# Patient Record
Sex: Male | Born: 1940
Health system: Southern US, Community
[De-identification: ages and names within clinical notes are randomized; demographics above are authoritative.]

## PROBLEM LIST (undated history)

## (undated) DIAGNOSIS — I10 Essential (primary) hypertension: Secondary | ICD-10-CM

## (undated) DIAGNOSIS — Z952 Presence of prosthetic heart valve: Secondary | ICD-10-CM

## (undated) DIAGNOSIS — Z9889 Other specified postprocedural states: Secondary | ICD-10-CM

## (undated) DIAGNOSIS — I2510 Atherosclerotic heart disease of native coronary artery without angina pectoris: Secondary | ICD-10-CM

## (undated) DIAGNOSIS — Z7901 Long term (current) use of anticoagulants: Secondary | ICD-10-CM

## (undated) DIAGNOSIS — Z951 Presence of aortocoronary bypass graft: Secondary | ICD-10-CM

## (undated) DIAGNOSIS — F39 Unspecified mood [affective] disorder: Secondary | ICD-10-CM

## (undated) DIAGNOSIS — E782 Mixed hyperlipidemia: Secondary | ICD-10-CM

## (undated) DIAGNOSIS — G8929 Other chronic pain: Secondary | ICD-10-CM

## (undated) DIAGNOSIS — M25511 Pain in right shoulder: Secondary | ICD-10-CM

## (undated) HISTORY — DX: Presence of aortocoronary bypass graft: Z95.1

## (undated) HISTORY — DX: Long term (current) use of anticoagulants: Z79.01

## (undated) HISTORY — DX: Pain in right shoulder: M25.511

## (undated) HISTORY — DX: Mixed hyperlipidemia: E78.2

## (undated) HISTORY — DX: Atherosclerotic heart disease of native coronary artery without angina pectoris: I25.10

## (undated) HISTORY — DX: Unspecified mood (affective) disorder: F39

## (undated) HISTORY — DX: Presence of prosthetic heart valve: Z95.2

## (undated) HISTORY — PX: CATARACT EXTRACTION, BILATERAL: SHX1313

## (undated) HISTORY — DX: Other specified postprocedural states: Z98.890

## (undated) HISTORY — DX: Essential (primary) hypertension: I10

## (undated) HISTORY — DX: Other chronic pain: G89.29

## (undated) HISTORY — PX: CARDIAC VALVE REPLACEMENT: SHX585

---

## 2003-04-17 ENCOUNTER — Ambulatory Visit (HOSPITAL_COMMUNITY): Admission: RE | Admit: 2003-04-17 | Discharge: 2003-04-18 | Payer: Self-pay | Admitting: Internal Medicine

## 2004-05-28 ENCOUNTER — Ambulatory Visit (HOSPITAL_COMMUNITY): Admission: RE | Admit: 2004-05-28 | Discharge: 2004-05-29 | Payer: Self-pay | Admitting: Cardiology

## 2004-05-28 ENCOUNTER — Ambulatory Visit: Payer: Self-pay | Admitting: Cardiology

## 2004-06-11 ENCOUNTER — Inpatient Hospital Stay (HOSPITAL_COMMUNITY): Admission: RE | Admit: 2004-06-11 | Discharge: 2004-06-17 | Payer: Self-pay | Admitting: Surgery

## 2004-07-08 ENCOUNTER — Encounter: Admission: RE | Admit: 2004-07-08 | Discharge: 2004-07-08 | Payer: Self-pay | Admitting: Surgery

## 2006-06-21 IMAGING — CR DG CHEST 2V
2 series · 2 of 2 positions shown · non-contrast
Comparison: 04/17/03.

CLINICAL DATA: Precardiac cath respiratory film. 
 PA AND LATERAL CHEST:

[view not recorded (1 of 2)]
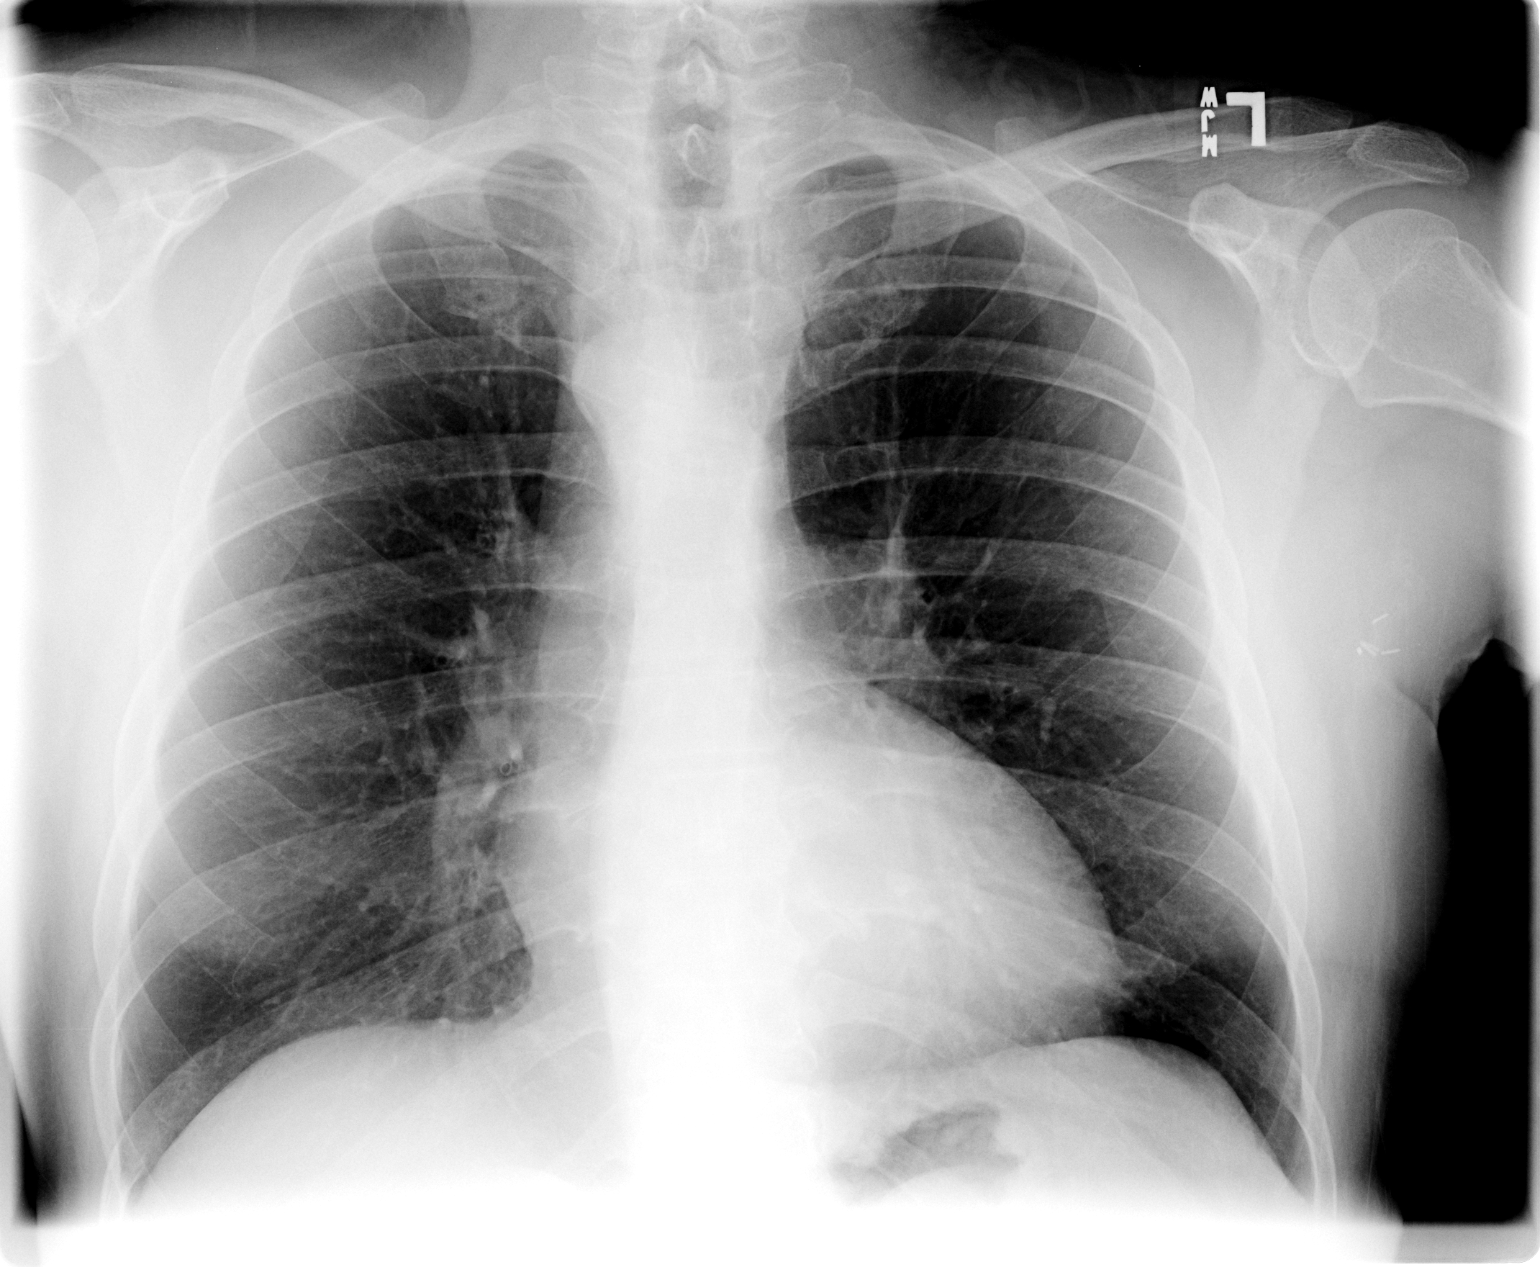

[view not recorded (2 of 2)]
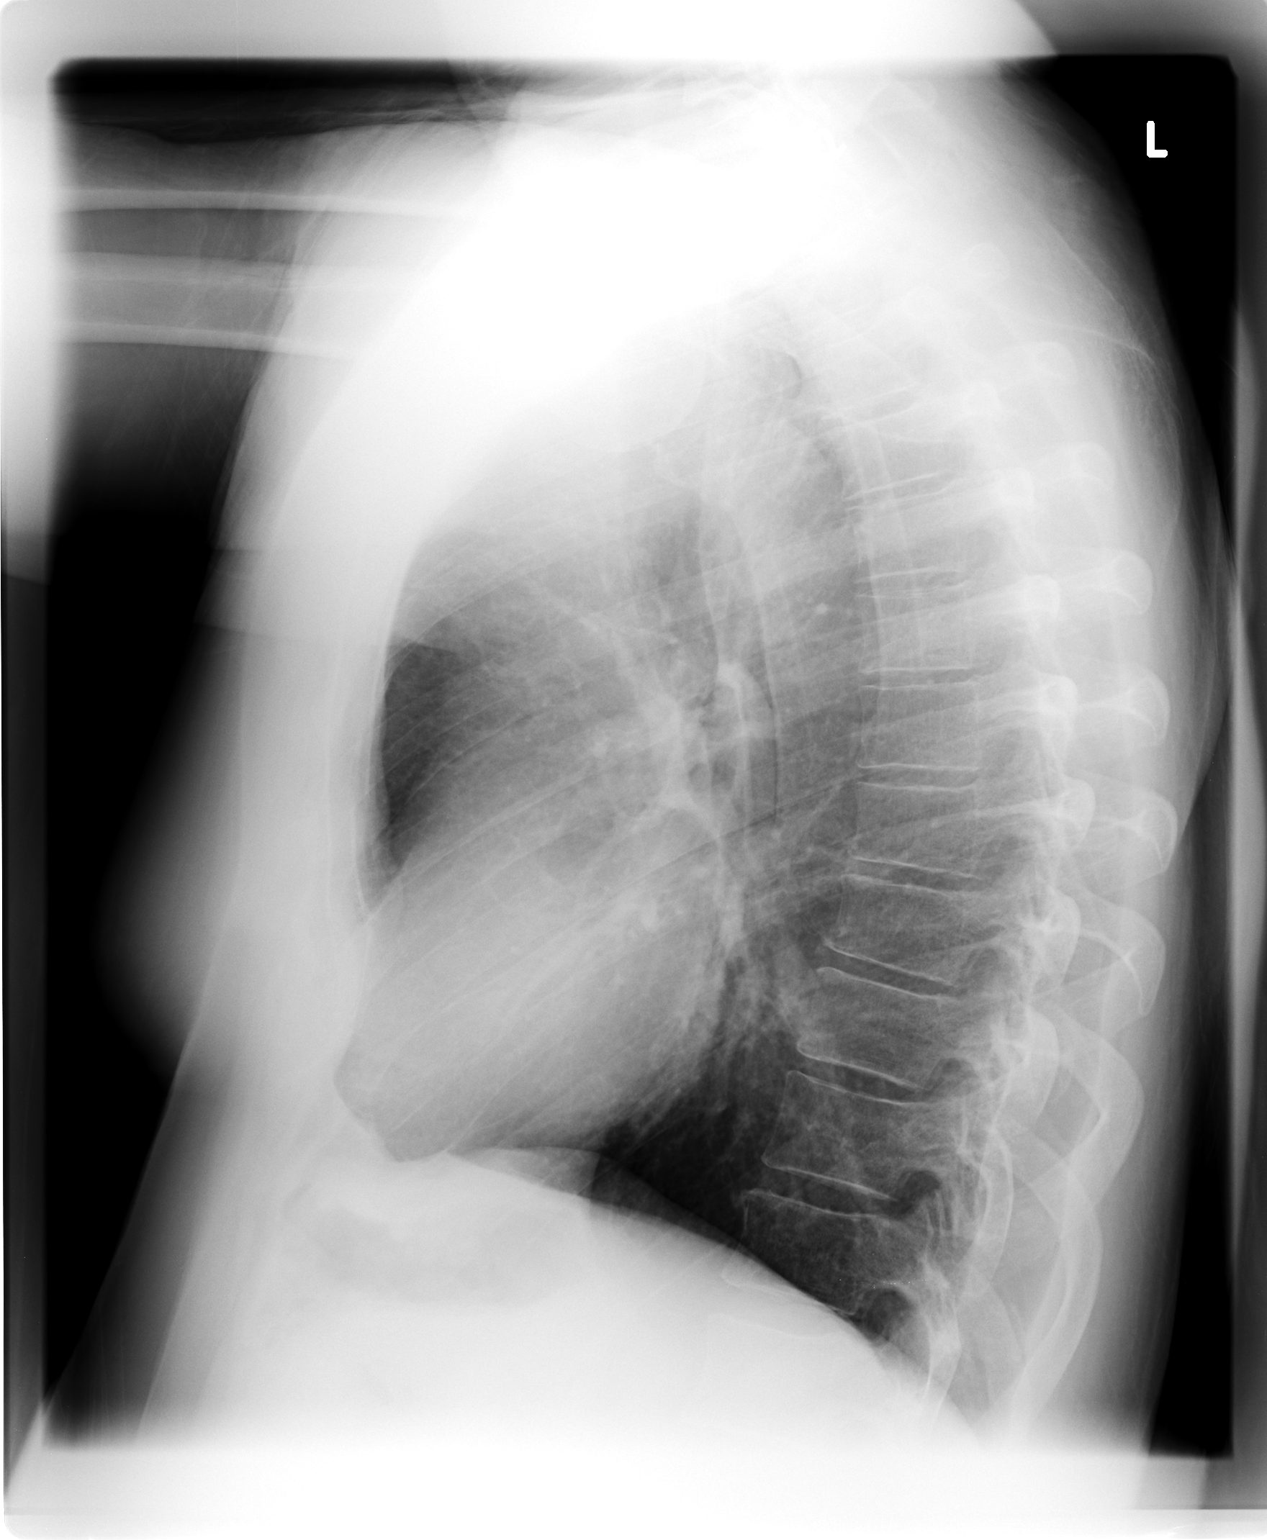

[2 of 2 positions shown; findings below may reference images not displayed]

FINDINGS: The lungs are clear.  The heart size is normal.  No pleural effusion.  No focal bony abnormality.  Clips are noted projecting over the left axilla.
IMPRESSION: No acute disease.

## 2010-02-23 ENCOUNTER — Encounter: Payer: Self-pay | Admitting: Surgery

## 2015-02-13 DIAGNOSIS — I4891 Unspecified atrial fibrillation: Secondary | ICD-10-CM | POA: Diagnosis not present

## 2015-02-13 DIAGNOSIS — Z7901 Long term (current) use of anticoagulants: Secondary | ICD-10-CM | POA: Diagnosis not present

## 2015-02-28 DIAGNOSIS — M19011 Primary osteoarthritis, right shoulder: Secondary | ICD-10-CM | POA: Diagnosis not present

## 2015-02-28 DIAGNOSIS — M25511 Pain in right shoulder: Secondary | ICD-10-CM

## 2015-02-28 DIAGNOSIS — M7541 Impingement syndrome of right shoulder: Secondary | ICD-10-CM | POA: Diagnosis not present

## 2015-02-28 DIAGNOSIS — G8929 Other chronic pain: Secondary | ICD-10-CM | POA: Insufficient documentation

## 2015-02-28 HISTORY — DX: Other chronic pain: G89.29

## 2015-03-04 DIAGNOSIS — M25511 Pain in right shoulder: Secondary | ICD-10-CM | POA: Diagnosis not present

## 2015-03-04 DIAGNOSIS — M7541 Impingement syndrome of right shoulder: Secondary | ICD-10-CM | POA: Diagnosis not present

## 2015-03-06 DIAGNOSIS — M7541 Impingement syndrome of right shoulder: Secondary | ICD-10-CM | POA: Diagnosis not present

## 2015-03-06 DIAGNOSIS — M25511 Pain in right shoulder: Secondary | ICD-10-CM | POA: Diagnosis not present

## 2015-03-12 DIAGNOSIS — M7541 Impingement syndrome of right shoulder: Secondary | ICD-10-CM | POA: Diagnosis not present

## 2015-03-12 DIAGNOSIS — M25511 Pain in right shoulder: Secondary | ICD-10-CM | POA: Diagnosis not present

## 2015-03-12 DIAGNOSIS — I482 Chronic atrial fibrillation: Secondary | ICD-10-CM | POA: Diagnosis not present

## 2015-03-14 DIAGNOSIS — M7541 Impingement syndrome of right shoulder: Secondary | ICD-10-CM | POA: Diagnosis not present

## 2015-03-14 DIAGNOSIS — M25511 Pain in right shoulder: Secondary | ICD-10-CM | POA: Diagnosis not present

## 2015-03-18 DIAGNOSIS — M25511 Pain in right shoulder: Secondary | ICD-10-CM | POA: Diagnosis not present

## 2015-03-18 DIAGNOSIS — M7541 Impingement syndrome of right shoulder: Secondary | ICD-10-CM | POA: Diagnosis not present

## 2015-03-21 DIAGNOSIS — M25511 Pain in right shoulder: Secondary | ICD-10-CM | POA: Diagnosis not present

## 2015-03-21 DIAGNOSIS — M7541 Impingement syndrome of right shoulder: Secondary | ICD-10-CM | POA: Diagnosis not present

## 2015-03-25 DIAGNOSIS — M7541 Impingement syndrome of right shoulder: Secondary | ICD-10-CM | POA: Diagnosis not present

## 2015-03-25 DIAGNOSIS — M25511 Pain in right shoulder: Secondary | ICD-10-CM | POA: Diagnosis not present

## 2015-03-27 DIAGNOSIS — M25511 Pain in right shoulder: Secondary | ICD-10-CM | POA: Diagnosis not present

## 2015-03-27 DIAGNOSIS — M7541 Impingement syndrome of right shoulder: Secondary | ICD-10-CM | POA: Diagnosis not present

## 2015-04-01 DIAGNOSIS — I4891 Unspecified atrial fibrillation: Secondary | ICD-10-CM | POA: Diagnosis not present

## 2015-04-01 DIAGNOSIS — M7541 Impingement syndrome of right shoulder: Secondary | ICD-10-CM | POA: Diagnosis not present

## 2015-04-01 DIAGNOSIS — M25511 Pain in right shoulder: Secondary | ICD-10-CM | POA: Diagnosis not present

## 2015-04-04 DIAGNOSIS — M25511 Pain in right shoulder: Secondary | ICD-10-CM | POA: Diagnosis not present

## 2015-04-10 DIAGNOSIS — M7551 Bursitis of right shoulder: Secondary | ICD-10-CM | POA: Diagnosis not present

## 2015-04-10 DIAGNOSIS — M25542 Pain in joints of left hand: Secondary | ICD-10-CM | POA: Diagnosis not present

## 2015-04-10 DIAGNOSIS — M25541 Pain in joints of right hand: Secondary | ICD-10-CM | POA: Diagnosis not present

## 2015-04-10 DIAGNOSIS — S46811A Strain of other muscles, fascia and tendons at shoulder and upper arm level, right arm, initial encounter: Secondary | ICD-10-CM | POA: Diagnosis not present

## 2015-04-10 DIAGNOSIS — M25511 Pain in right shoulder: Secondary | ICD-10-CM | POA: Diagnosis not present

## 2015-04-10 DIAGNOSIS — X58XXXA Exposure to other specified factors, initial encounter: Secondary | ICD-10-CM | POA: Diagnosis not present

## 2015-04-10 DIAGNOSIS — R609 Edema, unspecified: Secondary | ICD-10-CM | POA: Diagnosis not present

## 2015-04-10 DIAGNOSIS — E568 Deficiency of other vitamins: Secondary | ICD-10-CM | POA: Diagnosis not present

## 2015-04-10 DIAGNOSIS — M7541 Impingement syndrome of right shoulder: Secondary | ICD-10-CM | POA: Diagnosis not present

## 2015-04-15 DIAGNOSIS — M7541 Impingement syndrome of right shoulder: Secondary | ICD-10-CM | POA: Diagnosis not present

## 2015-04-16 DIAGNOSIS — Z7901 Long term (current) use of anticoagulants: Secondary | ICD-10-CM | POA: Diagnosis not present

## 2015-04-16 DIAGNOSIS — I482 Chronic atrial fibrillation: Secondary | ICD-10-CM | POA: Diagnosis not present

## 2015-04-16 DIAGNOSIS — I4891 Unspecified atrial fibrillation: Secondary | ICD-10-CM | POA: Diagnosis not present

## 2015-04-22 DIAGNOSIS — I482 Chronic atrial fibrillation: Secondary | ICD-10-CM | POA: Diagnosis not present

## 2015-04-22 DIAGNOSIS — Z7901 Long term (current) use of anticoagulants: Secondary | ICD-10-CM | POA: Diagnosis not present

## 2015-04-22 DIAGNOSIS — I4891 Unspecified atrial fibrillation: Secondary | ICD-10-CM | POA: Diagnosis not present

## 2015-04-25 DIAGNOSIS — Z01810 Encounter for preprocedural cardiovascular examination: Secondary | ICD-10-CM | POA: Diagnosis not present

## 2015-04-25 DIAGNOSIS — Z7901 Long term (current) use of anticoagulants: Secondary | ICD-10-CM | POA: Diagnosis not present

## 2015-04-25 DIAGNOSIS — E785 Hyperlipidemia, unspecified: Secondary | ICD-10-CM | POA: Diagnosis not present

## 2015-04-25 DIAGNOSIS — Z951 Presence of aortocoronary bypass graft: Secondary | ICD-10-CM

## 2015-04-25 DIAGNOSIS — I482 Chronic atrial fibrillation: Secondary | ICD-10-CM | POA: Diagnosis not present

## 2015-04-25 DIAGNOSIS — Z952 Presence of prosthetic heart valve: Secondary | ICD-10-CM | POA: Diagnosis not present

## 2015-04-25 DIAGNOSIS — I119 Hypertensive heart disease without heart failure: Secondary | ICD-10-CM | POA: Diagnosis not present

## 2015-04-25 DIAGNOSIS — Z9889 Other specified postprocedural states: Secondary | ICD-10-CM

## 2015-04-25 DIAGNOSIS — I2510 Atherosclerotic heart disease of native coronary artery without angina pectoris: Secondary | ICD-10-CM | POA: Insufficient documentation

## 2015-04-25 HISTORY — DX: Atherosclerotic heart disease of native coronary artery without angina pectoris: I25.10

## 2015-04-25 HISTORY — DX: Presence of aortocoronary bypass graft: Z95.1

## 2015-04-25 HISTORY — DX: Other specified postprocedural states: Z98.890

## 2015-04-29 DIAGNOSIS — Z9889 Other specified postprocedural states: Secondary | ICD-10-CM | POA: Diagnosis not present

## 2015-04-29 DIAGNOSIS — I482 Chronic atrial fibrillation: Secondary | ICD-10-CM | POA: Diagnosis not present

## 2015-04-29 DIAGNOSIS — Z7901 Long term (current) use of anticoagulants: Secondary | ICD-10-CM | POA: Diagnosis not present

## 2015-05-01 DIAGNOSIS — Z7901 Long term (current) use of anticoagulants: Secondary | ICD-10-CM | POA: Diagnosis not present

## 2015-05-02 DIAGNOSIS — Z7901 Long term (current) use of anticoagulants: Secondary | ICD-10-CM | POA: Diagnosis not present

## 2015-05-02 DIAGNOSIS — I482 Chronic atrial fibrillation: Secondary | ICD-10-CM | POA: Diagnosis not present

## 2015-05-08 DIAGNOSIS — I482 Chronic atrial fibrillation: Secondary | ICD-10-CM | POA: Diagnosis not present

## 2015-05-08 DIAGNOSIS — Z952 Presence of prosthetic heart valve: Secondary | ICD-10-CM | POA: Diagnosis not present

## 2015-05-08 DIAGNOSIS — Z7901 Long term (current) use of anticoagulants: Secondary | ICD-10-CM | POA: Diagnosis not present

## 2015-05-13 DIAGNOSIS — Z7901 Long term (current) use of anticoagulants: Secondary | ICD-10-CM | POA: Diagnosis not present

## 2015-05-13 DIAGNOSIS — I4891 Unspecified atrial fibrillation: Secondary | ICD-10-CM | POA: Diagnosis not present

## 2015-05-14 DIAGNOSIS — I482 Chronic atrial fibrillation: Secondary | ICD-10-CM | POA: Diagnosis not present

## 2015-05-14 DIAGNOSIS — Z7901 Long term (current) use of anticoagulants: Secondary | ICD-10-CM | POA: Diagnosis not present

## 2015-05-15 DIAGNOSIS — M7541 Impingement syndrome of right shoulder: Secondary | ICD-10-CM | POA: Diagnosis not present

## 2015-05-15 DIAGNOSIS — M659 Synovitis and tenosynovitis, unspecified: Secondary | ICD-10-CM | POA: Diagnosis not present

## 2015-05-15 DIAGNOSIS — M75111 Incomplete rotator cuff tear or rupture of right shoulder, not specified as traumatic: Secondary | ICD-10-CM | POA: Diagnosis not present

## 2015-05-15 DIAGNOSIS — S46111A Strain of muscle, fascia and tendon of long head of biceps, right arm, initial encounter: Secondary | ICD-10-CM | POA: Diagnosis not present

## 2015-05-15 DIAGNOSIS — G8918 Other acute postprocedural pain: Secondary | ICD-10-CM | POA: Diagnosis not present

## 2015-05-15 DIAGNOSIS — M19011 Primary osteoarthritis, right shoulder: Secondary | ICD-10-CM | POA: Diagnosis not present

## 2015-05-16 DIAGNOSIS — Z7901 Long term (current) use of anticoagulants: Secondary | ICD-10-CM | POA: Diagnosis not present

## 2015-05-16 DIAGNOSIS — I482 Chronic atrial fibrillation: Secondary | ICD-10-CM | POA: Diagnosis not present

## 2015-05-18 DIAGNOSIS — Z952 Presence of prosthetic heart valve: Secondary | ICD-10-CM | POA: Diagnosis not present

## 2015-05-20 DIAGNOSIS — Z9889 Other specified postprocedural states: Secondary | ICD-10-CM

## 2015-05-20 DIAGNOSIS — Z7901 Long term (current) use of anticoagulants: Secondary | ICD-10-CM | POA: Diagnosis not present

## 2015-05-20 DIAGNOSIS — I482 Chronic atrial fibrillation: Secondary | ICD-10-CM | POA: Diagnosis not present

## 2015-05-20 DIAGNOSIS — Z952 Presence of prosthetic heart valve: Secondary | ICD-10-CM | POA: Diagnosis not present

## 2015-05-20 HISTORY — DX: Other specified postprocedural states: Z98.890

## 2015-05-21 DIAGNOSIS — M25511 Pain in right shoulder: Secondary | ICD-10-CM | POA: Diagnosis not present

## 2015-05-21 DIAGNOSIS — M7541 Impingement syndrome of right shoulder: Secondary | ICD-10-CM | POA: Diagnosis not present

## 2015-05-22 DIAGNOSIS — K5900 Constipation, unspecified: Secondary | ICD-10-CM | POA: Diagnosis not present

## 2015-05-22 DIAGNOSIS — K2900 Acute gastritis without bleeding: Secondary | ICD-10-CM | POA: Diagnosis not present

## 2015-05-22 DIAGNOSIS — R066 Hiccough: Secondary | ICD-10-CM | POA: Diagnosis not present

## 2015-05-24 DIAGNOSIS — M25511 Pain in right shoulder: Secondary | ICD-10-CM | POA: Diagnosis not present

## 2015-05-24 DIAGNOSIS — M7541 Impingement syndrome of right shoulder: Secondary | ICD-10-CM | POA: Diagnosis not present

## 2015-05-27 DIAGNOSIS — M25511 Pain in right shoulder: Secondary | ICD-10-CM | POA: Diagnosis not present

## 2015-05-27 DIAGNOSIS — Z7901 Long term (current) use of anticoagulants: Secondary | ICD-10-CM | POA: Diagnosis not present

## 2015-05-27 DIAGNOSIS — I482 Chronic atrial fibrillation: Secondary | ICD-10-CM | POA: Diagnosis not present

## 2015-05-27 DIAGNOSIS — Z952 Presence of prosthetic heart valve: Secondary | ICD-10-CM | POA: Diagnosis not present

## 2015-05-27 DIAGNOSIS — M7541 Impingement syndrome of right shoulder: Secondary | ICD-10-CM | POA: Diagnosis not present

## 2015-05-29 DIAGNOSIS — M7541 Impingement syndrome of right shoulder: Secondary | ICD-10-CM | POA: Diagnosis not present

## 2015-05-29 DIAGNOSIS — M25511 Pain in right shoulder: Secondary | ICD-10-CM | POA: Diagnosis not present

## 2015-05-31 DIAGNOSIS — M25511 Pain in right shoulder: Secondary | ICD-10-CM | POA: Diagnosis not present

## 2015-05-31 DIAGNOSIS — M7541 Impingement syndrome of right shoulder: Secondary | ICD-10-CM | POA: Diagnosis not present

## 2015-06-03 DIAGNOSIS — M7541 Impingement syndrome of right shoulder: Secondary | ICD-10-CM | POA: Diagnosis not present

## 2015-06-03 DIAGNOSIS — M25511 Pain in right shoulder: Secondary | ICD-10-CM | POA: Diagnosis not present

## 2015-06-05 DIAGNOSIS — M7541 Impingement syndrome of right shoulder: Secondary | ICD-10-CM | POA: Diagnosis not present

## 2015-06-05 DIAGNOSIS — M25511 Pain in right shoulder: Secondary | ICD-10-CM | POA: Diagnosis not present

## 2015-06-10 DIAGNOSIS — Z7901 Long term (current) use of anticoagulants: Secondary | ICD-10-CM | POA: Diagnosis not present

## 2015-06-10 DIAGNOSIS — M25511 Pain in right shoulder: Secondary | ICD-10-CM | POA: Diagnosis not present

## 2015-06-10 DIAGNOSIS — I482 Chronic atrial fibrillation: Secondary | ICD-10-CM | POA: Diagnosis not present

## 2015-06-10 DIAGNOSIS — M7541 Impingement syndrome of right shoulder: Secondary | ICD-10-CM | POA: Diagnosis not present

## 2015-06-10 DIAGNOSIS — Z952 Presence of prosthetic heart valve: Secondary | ICD-10-CM | POA: Diagnosis not present

## 2015-06-14 DIAGNOSIS — M25511 Pain in right shoulder: Secondary | ICD-10-CM | POA: Diagnosis not present

## 2015-06-14 DIAGNOSIS — M7541 Impingement syndrome of right shoulder: Secondary | ICD-10-CM | POA: Diagnosis not present

## 2015-06-18 DIAGNOSIS — M7541 Impingement syndrome of right shoulder: Secondary | ICD-10-CM | POA: Diagnosis not present

## 2015-06-18 DIAGNOSIS — M25511 Pain in right shoulder: Secondary | ICD-10-CM | POA: Diagnosis not present

## 2015-06-21 DIAGNOSIS — M25511 Pain in right shoulder: Secondary | ICD-10-CM | POA: Diagnosis not present

## 2015-06-21 DIAGNOSIS — M7541 Impingement syndrome of right shoulder: Secondary | ICD-10-CM | POA: Diagnosis not present

## 2015-07-08 DIAGNOSIS — Z7901 Long term (current) use of anticoagulants: Secondary | ICD-10-CM | POA: Diagnosis not present

## 2015-07-08 DIAGNOSIS — I482 Chronic atrial fibrillation: Secondary | ICD-10-CM | POA: Diagnosis not present

## 2015-07-26 DIAGNOSIS — E559 Vitamin D deficiency, unspecified: Secondary | ICD-10-CM | POA: Diagnosis not present

## 2015-07-26 DIAGNOSIS — E782 Mixed hyperlipidemia: Secondary | ICD-10-CM | POA: Diagnosis not present

## 2015-07-26 DIAGNOSIS — H6123 Impacted cerumen, bilateral: Secondary | ICD-10-CM | POA: Diagnosis not present

## 2015-07-26 DIAGNOSIS — Z79899 Other long term (current) drug therapy: Secondary | ICD-10-CM | POA: Diagnosis not present

## 2015-07-26 DIAGNOSIS — M25562 Pain in left knee: Secondary | ICD-10-CM | POA: Diagnosis not present

## 2015-07-26 DIAGNOSIS — Z0000 Encounter for general adult medical examination without abnormal findings: Secondary | ICD-10-CM | POA: Diagnosis not present

## 2015-07-26 DIAGNOSIS — I350 Nonrheumatic aortic (valve) stenosis: Secondary | ICD-10-CM | POA: Diagnosis not present

## 2015-07-26 DIAGNOSIS — I2510 Atherosclerotic heart disease of native coronary artery without angina pectoris: Secondary | ICD-10-CM | POA: Diagnosis not present

## 2015-07-26 DIAGNOSIS — Z125 Encounter for screening for malignant neoplasm of prostate: Secondary | ICD-10-CM | POA: Diagnosis not present

## 2015-07-26 DIAGNOSIS — G8929 Other chronic pain: Secondary | ICD-10-CM | POA: Diagnosis not present

## 2015-07-26 DIAGNOSIS — D519 Vitamin B12 deficiency anemia, unspecified: Secondary | ICD-10-CM | POA: Diagnosis not present

## 2015-07-26 DIAGNOSIS — Z23 Encounter for immunization: Secondary | ICD-10-CM | POA: Diagnosis not present

## 2015-08-02 DIAGNOSIS — M25562 Pain in left knee: Secondary | ICD-10-CM | POA: Diagnosis not present

## 2015-08-05 DIAGNOSIS — E538 Deficiency of other specified B group vitamins: Secondary | ICD-10-CM | POA: Diagnosis not present

## 2015-08-05 DIAGNOSIS — I482 Chronic atrial fibrillation: Secondary | ICD-10-CM | POA: Diagnosis not present

## 2015-08-05 DIAGNOSIS — Z7901 Long term (current) use of anticoagulants: Secondary | ICD-10-CM | POA: Diagnosis not present

## 2015-08-27 DIAGNOSIS — Z1211 Encounter for screening for malignant neoplasm of colon: Secondary | ICD-10-CM | POA: Diagnosis not present

## 2015-09-02 DIAGNOSIS — I482 Chronic atrial fibrillation: Secondary | ICD-10-CM | POA: Diagnosis not present

## 2015-09-02 DIAGNOSIS — Z952 Presence of prosthetic heart valve: Secondary | ICD-10-CM | POA: Diagnosis not present

## 2015-09-02 DIAGNOSIS — Z7901 Long term (current) use of anticoagulants: Secondary | ICD-10-CM | POA: Diagnosis not present

## 2015-09-12 DIAGNOSIS — D510 Vitamin B12 deficiency anemia due to intrinsic factor deficiency: Secondary | ICD-10-CM | POA: Diagnosis not present

## 2015-09-25 DIAGNOSIS — R262 Difficulty in walking, not elsewhere classified: Secondary | ICD-10-CM | POA: Diagnosis not present

## 2015-09-25 DIAGNOSIS — M170 Bilateral primary osteoarthritis of knee: Secondary | ICD-10-CM | POA: Diagnosis not present

## 2015-09-25 DIAGNOSIS — M25561 Pain in right knee: Secondary | ICD-10-CM | POA: Diagnosis not present

## 2015-09-25 DIAGNOSIS — M25562 Pain in left knee: Secondary | ICD-10-CM | POA: Diagnosis not present

## 2015-09-30 DIAGNOSIS — I482 Chronic atrial fibrillation: Secondary | ICD-10-CM | POA: Diagnosis not present

## 2015-09-30 DIAGNOSIS — Z7901 Long term (current) use of anticoagulants: Secondary | ICD-10-CM | POA: Diagnosis not present

## 2015-09-30 DIAGNOSIS — Z952 Presence of prosthetic heart valve: Secondary | ICD-10-CM | POA: Diagnosis not present

## 2015-10-03 DIAGNOSIS — M170 Bilateral primary osteoarthritis of knee: Secondary | ICD-10-CM | POA: Diagnosis not present

## 2015-10-03 DIAGNOSIS — M1712 Unilateral primary osteoarthritis, left knee: Secondary | ICD-10-CM | POA: Diagnosis not present

## 2015-10-03 DIAGNOSIS — M25562 Pain in left knee: Secondary | ICD-10-CM | POA: Diagnosis not present

## 2015-10-03 DIAGNOSIS — R262 Difficulty in walking, not elsewhere classified: Secondary | ICD-10-CM | POA: Diagnosis not present

## 2015-10-10 DIAGNOSIS — M1712 Unilateral primary osteoarthritis, left knee: Secondary | ICD-10-CM | POA: Diagnosis not present

## 2015-10-10 DIAGNOSIS — M25562 Pain in left knee: Secondary | ICD-10-CM | POA: Diagnosis not present

## 2015-10-11 DIAGNOSIS — M25542 Pain in joints of left hand: Secondary | ICD-10-CM | POA: Diagnosis not present

## 2015-10-11 DIAGNOSIS — R609 Edema, unspecified: Secondary | ICD-10-CM | POA: Diagnosis not present

## 2015-10-11 DIAGNOSIS — M25541 Pain in joints of right hand: Secondary | ICD-10-CM | POA: Diagnosis not present

## 2015-10-11 DIAGNOSIS — E568 Deficiency of other vitamins: Secondary | ICD-10-CM | POA: Diagnosis not present

## 2015-10-11 DIAGNOSIS — Z0000 Encounter for general adult medical examination without abnormal findings: Secondary | ICD-10-CM | POA: Diagnosis not present

## 2015-10-17 DIAGNOSIS — M25562 Pain in left knee: Secondary | ICD-10-CM | POA: Diagnosis not present

## 2015-10-17 DIAGNOSIS — M1712 Unilateral primary osteoarthritis, left knee: Secondary | ICD-10-CM | POA: Diagnosis not present

## 2015-10-24 DIAGNOSIS — M1712 Unilateral primary osteoarthritis, left knee: Secondary | ICD-10-CM | POA: Diagnosis not present

## 2015-10-24 DIAGNOSIS — M25562 Pain in left knee: Secondary | ICD-10-CM | POA: Diagnosis not present

## 2015-10-29 DIAGNOSIS — I482 Chronic atrial fibrillation: Secondary | ICD-10-CM | POA: Diagnosis not present

## 2015-10-29 DIAGNOSIS — Z7901 Long term (current) use of anticoagulants: Secondary | ICD-10-CM | POA: Diagnosis not present

## 2015-10-29 DIAGNOSIS — Z952 Presence of prosthetic heart valve: Secondary | ICD-10-CM | POA: Diagnosis not present

## 2015-10-31 DIAGNOSIS — M1712 Unilateral primary osteoarthritis, left knee: Secondary | ICD-10-CM | POA: Diagnosis not present

## 2015-10-31 DIAGNOSIS — M25562 Pain in left knee: Secondary | ICD-10-CM | POA: Diagnosis not present

## 2015-11-05 DIAGNOSIS — M2550 Pain in unspecified joint: Secondary | ICD-10-CM | POA: Diagnosis not present

## 2015-11-05 DIAGNOSIS — Z862 Personal history of diseases of the blood and blood-forming organs and certain disorders involving the immune mechanism: Secondary | ICD-10-CM | POA: Diagnosis not present

## 2015-11-05 DIAGNOSIS — R609 Edema, unspecified: Secondary | ICD-10-CM | POA: Diagnosis not present

## 2015-11-22 DIAGNOSIS — H2513 Age-related nuclear cataract, bilateral: Secondary | ICD-10-CM | POA: Diagnosis not present

## 2015-11-22 DIAGNOSIS — H5203 Hypermetropia, bilateral: Secondary | ICD-10-CM | POA: Diagnosis not present

## 2015-11-22 DIAGNOSIS — H3500 Unspecified background retinopathy: Secondary | ICD-10-CM | POA: Diagnosis not present

## 2015-11-26 DIAGNOSIS — I482 Chronic atrial fibrillation: Secondary | ICD-10-CM | POA: Diagnosis not present

## 2015-11-26 DIAGNOSIS — Z7901 Long term (current) use of anticoagulants: Secondary | ICD-10-CM | POA: Diagnosis not present

## 2015-11-26 DIAGNOSIS — Z952 Presence of prosthetic heart valve: Secondary | ICD-10-CM | POA: Diagnosis not present

## 2015-12-19 DIAGNOSIS — E538 Deficiency of other specified B group vitamins: Secondary | ICD-10-CM | POA: Diagnosis not present

## 2015-12-24 DIAGNOSIS — I482 Chronic atrial fibrillation: Secondary | ICD-10-CM | POA: Diagnosis not present

## 2015-12-24 DIAGNOSIS — Z952 Presence of prosthetic heart valve: Secondary | ICD-10-CM | POA: Diagnosis not present

## 2015-12-24 DIAGNOSIS — Z7901 Long term (current) use of anticoagulants: Secondary | ICD-10-CM | POA: Diagnosis not present

## 2016-01-17 DIAGNOSIS — E538 Deficiency of other specified B group vitamins: Secondary | ICD-10-CM | POA: Diagnosis not present

## 2016-01-20 DIAGNOSIS — H3563 Retinal hemorrhage, bilateral: Secondary | ICD-10-CM | POA: Diagnosis not present

## 2016-01-20 DIAGNOSIS — Z9181 History of falling: Secondary | ICD-10-CM | POA: Diagnosis not present

## 2016-01-20 DIAGNOSIS — Z1389 Encounter for screening for other disorder: Secondary | ICD-10-CM | POA: Diagnosis not present

## 2016-01-20 DIAGNOSIS — G5603 Carpal tunnel syndrome, bilateral upper limbs: Secondary | ICD-10-CM | POA: Diagnosis not present

## 2016-01-21 DIAGNOSIS — Z952 Presence of prosthetic heart valve: Secondary | ICD-10-CM | POA: Diagnosis not present

## 2016-01-21 DIAGNOSIS — I482 Chronic atrial fibrillation: Secondary | ICD-10-CM | POA: Diagnosis not present

## 2016-01-21 DIAGNOSIS — Z7901 Long term (current) use of anticoagulants: Secondary | ICD-10-CM | POA: Diagnosis not present

## 2016-02-04 DIAGNOSIS — Z7901 Long term (current) use of anticoagulants: Secondary | ICD-10-CM | POA: Diagnosis not present

## 2016-02-04 DIAGNOSIS — Z952 Presence of prosthetic heart valve: Secondary | ICD-10-CM | POA: Diagnosis not present

## 2016-02-04 DIAGNOSIS — I482 Chronic atrial fibrillation: Secondary | ICD-10-CM | POA: Diagnosis not present

## 2016-02-18 DIAGNOSIS — Z952 Presence of prosthetic heart valve: Secondary | ICD-10-CM | POA: Diagnosis not present

## 2016-02-18 DIAGNOSIS — Z7901 Long term (current) use of anticoagulants: Secondary | ICD-10-CM | POA: Diagnosis not present

## 2016-02-18 DIAGNOSIS — I482 Chronic atrial fibrillation: Secondary | ICD-10-CM | POA: Diagnosis not present

## 2016-02-24 DIAGNOSIS — D510 Vitamin B12 deficiency anemia due to intrinsic factor deficiency: Secondary | ICD-10-CM | POA: Diagnosis not present

## 2016-03-03 DIAGNOSIS — Z7901 Long term (current) use of anticoagulants: Secondary | ICD-10-CM | POA: Diagnosis not present

## 2016-03-03 DIAGNOSIS — I482 Chronic atrial fibrillation: Secondary | ICD-10-CM | POA: Diagnosis not present

## 2016-03-03 DIAGNOSIS — Z952 Presence of prosthetic heart valve: Secondary | ICD-10-CM | POA: Diagnosis not present

## 2016-03-19 DIAGNOSIS — E538 Deficiency of other specified B group vitamins: Secondary | ICD-10-CM | POA: Diagnosis not present

## 2016-03-31 DIAGNOSIS — I482 Chronic atrial fibrillation: Secondary | ICD-10-CM | POA: Diagnosis not present

## 2016-03-31 DIAGNOSIS — Z7901 Long term (current) use of anticoagulants: Secondary | ICD-10-CM | POA: Diagnosis not present

## 2016-03-31 DIAGNOSIS — Z952 Presence of prosthetic heart valve: Secondary | ICD-10-CM | POA: Diagnosis not present

## 2016-04-08 DIAGNOSIS — M25511 Pain in right shoulder: Secondary | ICD-10-CM | POA: Diagnosis not present

## 2016-04-24 DIAGNOSIS — D510 Vitamin B12 deficiency anemia due to intrinsic factor deficiency: Secondary | ICD-10-CM | POA: Diagnosis not present

## 2016-04-28 DIAGNOSIS — Z7901 Long term (current) use of anticoagulants: Secondary | ICD-10-CM | POA: Diagnosis not present

## 2016-04-28 DIAGNOSIS — I4891 Unspecified atrial fibrillation: Secondary | ICD-10-CM | POA: Diagnosis not present

## 2016-04-28 DIAGNOSIS — Z952 Presence of prosthetic heart valve: Secondary | ICD-10-CM | POA: Diagnosis not present

## 2016-05-19 DIAGNOSIS — D510 Vitamin B12 deficiency anemia due to intrinsic factor deficiency: Secondary | ICD-10-CM | POA: Diagnosis not present

## 2016-05-26 DIAGNOSIS — I2510 Atherosclerotic heart disease of native coronary artery without angina pectoris: Secondary | ICD-10-CM | POA: Diagnosis not present

## 2016-05-26 DIAGNOSIS — I482 Chronic atrial fibrillation: Secondary | ICD-10-CM | POA: Diagnosis not present

## 2016-05-26 DIAGNOSIS — Z952 Presence of prosthetic heart valve: Secondary | ICD-10-CM | POA: Diagnosis not present

## 2016-05-26 DIAGNOSIS — Z7901 Long term (current) use of anticoagulants: Secondary | ICD-10-CM | POA: Diagnosis not present

## 2016-05-26 DIAGNOSIS — I10 Essential (primary) hypertension: Secondary | ICD-10-CM | POA: Diagnosis not present

## 2016-05-26 DIAGNOSIS — I4891 Unspecified atrial fibrillation: Secondary | ICD-10-CM | POA: Diagnosis not present

## 2016-05-26 DIAGNOSIS — Z9229 Personal history of other drug therapy: Secondary | ICD-10-CM | POA: Diagnosis not present

## 2016-05-26 DIAGNOSIS — I119 Hypertensive heart disease without heart failure: Secondary | ICD-10-CM | POA: Diagnosis not present

## 2016-06-04 DIAGNOSIS — L089 Local infection of the skin and subcutaneous tissue, unspecified: Secondary | ICD-10-CM | POA: Diagnosis not present

## 2016-06-04 DIAGNOSIS — Z6824 Body mass index (BMI) 24.0-24.9, adult: Secondary | ICD-10-CM | POA: Diagnosis not present

## 2016-06-09 DIAGNOSIS — W57XXXS Bitten or stung by nonvenomous insect and other nonvenomous arthropods, sequela: Secondary | ICD-10-CM | POA: Diagnosis not present

## 2016-06-09 DIAGNOSIS — Z6824 Body mass index (BMI) 24.0-24.9, adult: Secondary | ICD-10-CM | POA: Diagnosis not present

## 2016-06-09 DIAGNOSIS — S80862S Insect bite (nonvenomous), left lower leg, sequela: Secondary | ICD-10-CM | POA: Diagnosis not present

## 2016-06-09 DIAGNOSIS — L03116 Cellulitis of left lower limb: Secondary | ICD-10-CM | POA: Diagnosis not present

## 2016-06-09 DIAGNOSIS — L089 Local infection of the skin and subcutaneous tissue, unspecified: Secondary | ICD-10-CM | POA: Diagnosis not present

## 2016-06-15 DIAGNOSIS — D510 Vitamin B12 deficiency anemia due to intrinsic factor deficiency: Secondary | ICD-10-CM | POA: Diagnosis not present

## 2016-06-23 DIAGNOSIS — Z952 Presence of prosthetic heart valve: Secondary | ICD-10-CM | POA: Diagnosis not present

## 2016-06-23 DIAGNOSIS — I10 Essential (primary) hypertension: Secondary | ICD-10-CM | POA: Diagnosis not present

## 2016-06-23 DIAGNOSIS — I119 Hypertensive heart disease without heart failure: Secondary | ICD-10-CM | POA: Diagnosis not present

## 2016-06-23 DIAGNOSIS — I2510 Atherosclerotic heart disease of native coronary artery without angina pectoris: Secondary | ICD-10-CM | POA: Diagnosis not present

## 2016-07-07 DIAGNOSIS — Z9229 Personal history of other drug therapy: Secondary | ICD-10-CM | POA: Diagnosis not present

## 2016-07-07 DIAGNOSIS — I4891 Unspecified atrial fibrillation: Secondary | ICD-10-CM | POA: Diagnosis not present

## 2016-07-07 DIAGNOSIS — Z952 Presence of prosthetic heart valve: Secondary | ICD-10-CM | POA: Diagnosis not present

## 2016-08-04 DIAGNOSIS — Z952 Presence of prosthetic heart valve: Secondary | ICD-10-CM | POA: Diagnosis not present

## 2016-08-04 DIAGNOSIS — Z9229 Personal history of other drug therapy: Secondary | ICD-10-CM | POA: Diagnosis not present

## 2016-08-04 DIAGNOSIS — I4891 Unspecified atrial fibrillation: Secondary | ICD-10-CM | POA: Diagnosis not present

## 2016-08-04 DIAGNOSIS — Z7901 Long term (current) use of anticoagulants: Secondary | ICD-10-CM | POA: Diagnosis not present

## 2016-09-02 DIAGNOSIS — I10 Essential (primary) hypertension: Secondary | ICD-10-CM

## 2016-09-02 DIAGNOSIS — E782 Mixed hyperlipidemia: Secondary | ICD-10-CM

## 2016-09-02 DIAGNOSIS — F39 Unspecified mood [affective] disorder: Secondary | ICD-10-CM | POA: Insufficient documentation

## 2016-09-02 DIAGNOSIS — Z0000 Encounter for general adult medical examination without abnormal findings: Secondary | ICD-10-CM | POA: Diagnosis not present

## 2016-09-02 HISTORY — DX: Essential (primary) hypertension: I10

## 2016-09-02 HISTORY — DX: Mixed hyperlipidemia: E78.2

## 2016-09-02 HISTORY — DX: Unspecified mood (affective) disorder: F39

## 2016-10-12 DIAGNOSIS — Z7901 Long term (current) use of anticoagulants: Secondary | ICD-10-CM

## 2016-10-12 DIAGNOSIS — Z952 Presence of prosthetic heart valve: Secondary | ICD-10-CM | POA: Insufficient documentation

## 2016-10-12 HISTORY — DX: Presence of prosthetic heart valve: Z95.2

## 2016-10-12 HISTORY — DX: Long term (current) use of anticoagulants: Z79.01

## 2016-10-26 ENCOUNTER — Ambulatory Visit (INDEPENDENT_AMBULATORY_CARE_PROVIDER_SITE_OTHER): Payer: PPO | Admitting: Cardiology

## 2016-10-26 ENCOUNTER — Encounter: Payer: Self-pay | Admitting: Cardiology

## 2016-10-26 ENCOUNTER — Ambulatory Visit: Payer: PPO

## 2016-10-26 VITALS — BP 180/70 | HR 58 | Ht 69.0 in | Wt 162.0 lb

## 2016-10-26 DIAGNOSIS — Z7901 Long term (current) use of anticoagulants: Secondary | ICD-10-CM | POA: Diagnosis not present

## 2016-10-26 DIAGNOSIS — I480 Paroxysmal atrial fibrillation: Secondary | ICD-10-CM | POA: Insufficient documentation

## 2016-10-26 DIAGNOSIS — I10 Essential (primary) hypertension: Secondary | ICD-10-CM | POA: Diagnosis not present

## 2016-10-26 DIAGNOSIS — E782 Mixed hyperlipidemia: Secondary | ICD-10-CM

## 2016-10-26 DIAGNOSIS — Z9889 Other specified postprocedural states: Secondary | ICD-10-CM

## 2016-10-26 DIAGNOSIS — Z952 Presence of prosthetic heart valve: Secondary | ICD-10-CM

## 2016-10-26 DIAGNOSIS — Z5181 Encounter for therapeutic drug level monitoring: Secondary | ICD-10-CM

## 2016-10-26 DIAGNOSIS — I4891 Unspecified atrial fibrillation: Secondary | ICD-10-CM | POA: Insufficient documentation

## 2016-10-26 DIAGNOSIS — I2510 Atherosclerotic heart disease of native coronary artery without angina pectoris: Secondary | ICD-10-CM | POA: Diagnosis not present

## 2016-10-26 HISTORY — DX: Encounter for therapeutic drug level monitoring: Z51.81

## 2016-10-26 HISTORY — DX: Paroxysmal atrial fibrillation: I48.0

## 2016-10-26 LAB — POCT INR: INR: 2.9

## 2016-10-26 MED ORDER — AMOXICILLIN 500 MG PO TABS: 2000.0000 mg | ORAL_TABLET | ORAL | 3 refills | Status: DC

## 2016-10-26 MED ORDER — TELMISARTAN-HCTZ 80-12.5 MG PO TABS: 1.0000 | ORAL_TABLET | Freq: Every day | ORAL | 6 refills | Status: DC

## 2016-10-26 NOTE — Progress Notes (Signed)
Cardiology Office Note:    Date:  10/26/2016   ID:  Erik Montgomery, DOB April 04, 1940, MRN [id number]  PCP:  Myrlene Broker, MD  Cardiologist:  Shirlee More, MD    Referring MD: Myrlene Broker, MD    ASSESSMENT:    1. S/P AVR   2. PAF (paroxysmal atrial fibrillation) ([place])   3. H/O mitral valve repair   4. Long term (current) use of anticoagulants   5. Essential hypertension   6. Coronary artery disease involving native coronary artery of native heart without angina pectoris   7. Mixed dyslipidemia    PLAN:    In order of problems listed above:  1. Stable continue warfarin goal INR 2.5. At this time I do not think he requires an echo cardiogram. 2. Stable, he has had no clinical recurrence 3. Stable no evidence of significant mitral regurgitation 4. Stable continue warfarin goal INR 2.5 5. Not at target, he will stop Ace start ARB diuretic and reduce amlodipine with edema. Follow-up 2 week renal function potassium and contact me if systolics remain greater than 140 6. Stable continue current medical treatment I do not think he requires an ischemia evaluation at this time 7. Stable continue his statin check liver function and lipids in 2 weeks   Next appointment: 6 months   Medication Adjustments/Labs and Tests Ordered: Current medicines are reviewed at length with the patient today.  Concerns regarding medicines are outlined above.  Orders Placed This Encounter  Procedures  . Comprehensive Metabolic Panel (CMET)  . Lipid Profile  . POCT INR   Meds ordered this encounter  Medications  . telmisartan-hydrochlorothiazide (MICARDIS HCT) 80-12.5 MG tablet    Sig: Take 1 tablet by mouth daily.    Dispense:  30 tablet    Refill:  6  . amoxicillin (AMOXIL) 500 MG tablet    Sig: Take 4 tablets (2,000 mg total) by mouth as directed. Take 500 mg by mouth once as needed.  Take 4 tablets 30 mins prior to dental procedure.    Dispense:  30 tablet    Refill:  3    Chief  Complaint  Patient presents with  . Follow-up    6 month flup   . Edema    both feet    History of Present Illness:    Erik Montgomery is a 76 y.o. male with a hx of CAD, Atrial Fibrillation, Dyslipidemia, HTN, S/P CABG with a no obstructive coronary stenosis and patent SVG to RCA at cath 2009 , Valvular heart disease with MV repair and a St Jude AVR in 2006 on warfarin last seen 6 months ago.since his calcium channel blockers doubled he has dependent edema at the ankles. He is surprised by his blood pressure in the office today last week was less than 951 systolic. He's had no bleeding complication from warfarin, no exercise intolerance chest pain shortness of breath palpitation syncope or TIA.Marland Kitchen Compliance with diet, lifestyle and medications: yes Past Medical History:  Diagnosis Date  . Chronic right shoulder pain 02/28/2015  . Coronary artery disease involving native coronary artery of native heart without angina pectoris 04/25/2015   Overview:  Patent  SVG to RCA at cath 2009  . Essential hypertension 09/02/2016  . H/O mitral valve repair 04/25/2015  . Heart valve replaced 10/12/2016  . Hx of CABG 04/25/2015  . Long term (current) use of anticoagulants 10/12/2016  . Mixed dyslipidemia 09/02/2016  . Mood disorder ([place]) 09/02/2016  . S/P arthroscopy of  shoulder 05/20/2015    Past Surgical History:  Procedure Laterality Date  . CARDIAC VALVE REPLACEMENT      Current Medications: Current Meds  Medication Sig  . amLODipine (NORVASC) 5 MG tablet Take 5 mg by mouth 1 day or 1 dose.  Marland Kitchen amoxicillin (AMOXIL) 500 MG tablet Take 4 tablets (2,000 mg total) by mouth as directed. Take 500 mg by mouth once as needed.  Take 4 tablets 30 mins prior to dental procedure.  . Glucos-Chondroit-Hyaluron-MSM (GLUCOSAMINE CHONDROITIN JOINT PO) Take 1 tablet by mouth daily.  . nitroGLYCERIN (NITROSTAT) 0.4 MG SL tablet Place 1 tablet (0.4 mg total) under the tongue every five (5) minutes as needed for chest pain.   . Omega-3 Fatty Acids (FISH OIL) 1000 MG CPDR Take 1 capsule by mouth daily.  . rosuvastatin (CRESTOR) 20 MG tablet Take 20 mg by mouth daily.  Marland Kitchen warfarin (JANTOVEN) 5 MG tablet Take 5 mg by mouth as directed. Currently taking 5mg  everyday except 2.5mg  on Mon, Wed, and Friday  . [DISCONTINUED] amoxicillin (AMOXIL) 500 MG tablet Take 500 mg by mouth as directed. Take 500 mg by mouth once as needed.  Take 4 tablets 30 mins prior to dental procedure.  . [DISCONTINUED] benazepril (LOTENSIN) 20 MG tablet Take 20 mg by mouth daily.     Allergies:   Patient has no known allergies.   Social History   Social History  . Marital status: Single    Spouse name: N/A  . Number of children: N/A  . Years of education: N/A   Social History Main Topics  . Smoking status: Never Smoker  . Smokeless tobacco: Never Used  . Alcohol use No  . Drug use: No  . Sexual activity: Not Asked   Other Topics Concern  . None   Social History Narrative  . None     Family History: The patient's family history includes Cancer in his father; Heart disease in his brother; Ovarian cancer in his mother. ROS:   Please see the history of present illness.    All other systems reviewed and are negative.  EKGs/Labs/Other Studies Reviewed:    The following studies were reviewed today:  EKG:  EKG electrocardiogram 05/27/16  Normal Sinus Rhythm  Recent Labs: recent INR:  3.2/2.8/4.0  No results found for requested labs within last 8760 hours.  Recent Lipid Panel 06/23/16 LDL 72, Chol 131 HDL 43, CMP normal except Glu 125 No results found for: CHOL, TRIG, HDL, CHOLHDL, VLDL, LDLCALC, LDLDIRECT  Physical Exam:    VS:  BP (!) 174/60 (BP Location: Right Arm, Patient Position: Sitting)   Pulse (!) 58   Ht 5\' 9"  (1.753 m)   Wt 162 lb (73.5 kg)   SpO2 97%   BMI 23.92 kg/m     Wt Readings from Last 3 Encounters:  10/26/16 162 lb (73.5 kg)     GEN:  Well nourished, well developed in no acute distress HEENT:  Normal NECK: No JVD; No carotid bruits LYMPHATICS: No lymphadenopathy CARDIAC: short closing sounds St. Jude aortic prosthesis, expected 1-2/6 outflow murmur, no aortic regurgitation RRR,  RESPIRATORY:  Clear to auscultation without rales, wheezing or rhonchi  ABDOMEN: Soft, non-tender, non-distended MUSCULOSKELETAL:  +1 ankle edema; No deformity  SKIN: Warm and dry NEUROLOGIC:  Alert and oriented x 3 PSYCHIATRIC:  Normal affect    Signed, Shirlee More, MD  10/26/2016 11:13 AM    [place]

## 2016-10-26 NOTE — Patient Instructions (Addendum)
Medication Instructions:  Your physician has recommended you make the following change in your medication:  STOP benzapril START telmisartan-hydrochlorothiazide 80-12.5 mg daily DECREASE amlodipine 5 mg daily  Call in 1 week if BP remains > 140 mm Hg  START amoxicillin 2,000 mg (4 tablets) 30 minutes before dental procedures.  Labwork: Your physician recommends that you return for lab work in: 2 weeks at the [facility] in [place], please be fasting. CMP, lipid.   Testing/Procedures: None  Follow-Up: Your physician wants you to follow-up in: 6 months. You will receive a reminder letter in the mail two months in advance. If you don't receive a letter, please call our office to schedule the follow-up appointment.   Any Other Special Instructions Will Be Listed Below (If Applicable).     If you need a refill on your cardiac medications before your next appointment, please call your pharmacy.

## 2016-11-04 DIAGNOSIS — J340 Abscess, furuncle and carbuncle of nose: Secondary | ICD-10-CM | POA: Diagnosis not present

## 2016-11-04 DIAGNOSIS — E782 Mixed hyperlipidemia: Secondary | ICD-10-CM | POA: Diagnosis not present

## 2016-11-04 DIAGNOSIS — Z23 Encounter for immunization: Secondary | ICD-10-CM | POA: Diagnosis not present

## 2016-11-05 LAB — LIPID PANEL
CHOL/HDL RATIO: 3.4 ratio (ref 0.0–5.0)
Cholesterol, Total: 137 mg/dL (ref 100–199)
HDL: 40 mg/dL (ref >39)
LDL CALC: 75 mg/dL (ref 0–99)
Triglycerides: 109 mg/dL (ref 0–149)
VLDL Cholesterol Cal: 22 mg/dL (ref 5–40)

## 2016-11-05 LAB — COMPREHENSIVE METABOLIC PANEL
A/G RATIO: 1.5 (ref 1.2–2.2)
ALT: 17 IU/L (ref 0–44)
AST: 21 IU/L (ref 0–40)
Albumin: 4.4 g/dL (ref 3.5–4.8)
Alkaline Phosphatase: 68 IU/L (ref 39–117)
BUN/Creatinine Ratio: 18 (ref 10–24)
BUN: 23 mg/dL (ref 8–27)
Bilirubin Total: 0.6 mg/dL (ref 0.0–1.2)
CALCIUM: 9.4 mg/dL (ref 8.6–10.2)
CO2: 24 mmol/L (ref 20–29)
CREATININE: 1.27 mg/dL (ref 0.76–1.27)
Chloride: 103 mmol/L (ref 96–106)
GFR, EST AFRICAN AMERICAN: 63 mL/min/{1.73_m2} (ref >59)
GFR, EST NON AFRICAN AMERICAN: 55 mL/min/{1.73_m2} — AB (ref >59)
GLOBULIN, TOTAL: 2.9 g/dL (ref 1.5–4.5)
Glucose: 106 mg/dL — ABNORMAL HIGH (ref 65–99)
POTASSIUM: 4.6 mmol/L (ref 3.5–5.2)
SODIUM: 142 mmol/L (ref 134–144)
TOTAL PROTEIN: 7.3 g/dL (ref 6.0–8.5)

## 2016-11-09 ENCOUNTER — Ambulatory Visit (INDEPENDENT_AMBULATORY_CARE_PROVIDER_SITE_OTHER): Payer: PPO

## 2016-11-09 DIAGNOSIS — Z952 Presence of prosthetic heart valve: Secondary | ICD-10-CM | POA: Diagnosis not present

## 2016-11-09 DIAGNOSIS — Z9889 Other specified postprocedural states: Secondary | ICD-10-CM | POA: Diagnosis not present

## 2016-11-09 DIAGNOSIS — I480 Paroxysmal atrial fibrillation: Secondary | ICD-10-CM

## 2016-11-09 DIAGNOSIS — Z5181 Encounter for therapeutic drug level monitoring: Secondary | ICD-10-CM

## 2016-11-09 LAB — POCT INR: INR: 2

## 2016-12-07 ENCOUNTER — Ambulatory Visit (INDEPENDENT_AMBULATORY_CARE_PROVIDER_SITE_OTHER): Payer: PPO | Admitting: *Deleted

## 2016-12-07 DIAGNOSIS — Z5181 Encounter for therapeutic drug level monitoring: Secondary | ICD-10-CM

## 2016-12-07 DIAGNOSIS — I480 Paroxysmal atrial fibrillation: Secondary | ICD-10-CM

## 2016-12-07 DIAGNOSIS — Z9889 Other specified postprocedural states: Secondary | ICD-10-CM

## 2016-12-07 DIAGNOSIS — Z952 Presence of prosthetic heart valve: Secondary | ICD-10-CM | POA: Diagnosis not present

## 2016-12-07 LAB — PROTIME-INR
INR: 1.7 — ABNORMAL HIGH (ref 0.8–1.2)
PROTHROMBIN TIME: 18.1 s — AB (ref 9.1–12.0)

## 2016-12-21 ENCOUNTER — Ambulatory Visit: Payer: PPO | Admitting: Pharmacist

## 2016-12-21 DIAGNOSIS — Z5181 Encounter for therapeutic drug level monitoring: Secondary | ICD-10-CM

## 2016-12-21 DIAGNOSIS — Z952 Presence of prosthetic heart valve: Secondary | ICD-10-CM

## 2016-12-21 DIAGNOSIS — I480 Paroxysmal atrial fibrillation: Secondary | ICD-10-CM

## 2016-12-21 DIAGNOSIS — Z9889 Other specified postprocedural states: Secondary | ICD-10-CM

## 2016-12-21 LAB — POCT INR: INR: 1.9

## 2016-12-21 NOTE — Patient Instructions (Signed)
Take 5mg  today, then start taking 5mg  daily except 2.5mg  on Mondays and Fridays. Recheck INR in 2-3 weeks.

## 2017-01-18 ENCOUNTER — Ambulatory Visit (INDEPENDENT_AMBULATORY_CARE_PROVIDER_SITE_OTHER): Payer: PPO

## 2017-01-18 DIAGNOSIS — Z9889 Other specified postprocedural states: Secondary | ICD-10-CM

## 2017-01-18 DIAGNOSIS — I480 Paroxysmal atrial fibrillation: Secondary | ICD-10-CM | POA: Diagnosis not present

## 2017-01-18 DIAGNOSIS — Z5181 Encounter for therapeutic drug level monitoring: Secondary | ICD-10-CM

## 2017-01-18 DIAGNOSIS — Z952 Presence of prosthetic heart valve: Secondary | ICD-10-CM | POA: Diagnosis not present

## 2017-01-18 LAB — POCT INR: INR: 1.9

## 2017-01-18 NOTE — Patient Instructions (Signed)
Start taking 5mg  daily except 2.5mg  on Friday. Recheck INR in 2 weeks.

## 2017-02-01 ENCOUNTER — Ambulatory Visit (INDEPENDENT_AMBULATORY_CARE_PROVIDER_SITE_OTHER): Payer: PPO | Admitting: Pharmacist

## 2017-02-01 DIAGNOSIS — Z952 Presence of prosthetic heart valve: Secondary | ICD-10-CM | POA: Diagnosis not present

## 2017-02-01 DIAGNOSIS — I480 Paroxysmal atrial fibrillation: Secondary | ICD-10-CM

## 2017-02-01 DIAGNOSIS — Z5181 Encounter for therapeutic drug level monitoring: Secondary | ICD-10-CM

## 2017-02-01 DIAGNOSIS — Z9889 Other specified postprocedural states: Secondary | ICD-10-CM | POA: Diagnosis not present

## 2017-02-01 LAB — POCT INR: INR: 2.6

## 2017-02-01 NOTE — Patient Instructions (Signed)
Description   Start taking 5mg  daily except 2.5mg  on Friday. Recheck INR in 3 weeks.

## 2017-02-22 ENCOUNTER — Ambulatory Visit (INDEPENDENT_AMBULATORY_CARE_PROVIDER_SITE_OTHER): Payer: PPO | Admitting: Pharmacist

## 2017-02-22 DIAGNOSIS — Z5181 Encounter for therapeutic drug level monitoring: Secondary | ICD-10-CM | POA: Diagnosis not present

## 2017-02-22 DIAGNOSIS — Z952 Presence of prosthetic heart valve: Secondary | ICD-10-CM | POA: Diagnosis not present

## 2017-02-22 DIAGNOSIS — I480 Paroxysmal atrial fibrillation: Secondary | ICD-10-CM

## 2017-02-22 DIAGNOSIS — Z9889 Other specified postprocedural states: Secondary | ICD-10-CM | POA: Diagnosis not present

## 2017-02-22 LAB — POCT INR: INR: 2.6

## 2017-02-22 NOTE — Patient Instructions (Signed)
Description   Continue taking 5mg  daily except 2.5mg  on Friday. Recheck INR in 4 weeks.

## 2017-03-22 ENCOUNTER — Ambulatory Visit (INDEPENDENT_AMBULATORY_CARE_PROVIDER_SITE_OTHER): Payer: PPO

## 2017-03-22 DIAGNOSIS — Z5181 Encounter for therapeutic drug level monitoring: Secondary | ICD-10-CM | POA: Diagnosis not present

## 2017-03-22 DIAGNOSIS — Z9889 Other specified postprocedural states: Secondary | ICD-10-CM | POA: Diagnosis not present

## 2017-03-22 DIAGNOSIS — I480 Paroxysmal atrial fibrillation: Secondary | ICD-10-CM | POA: Diagnosis not present

## 2017-03-22 DIAGNOSIS — Z952 Presence of prosthetic heart valve: Secondary | ICD-10-CM

## 2017-03-22 LAB — POCT INR: INR: 2.7

## 2017-03-22 NOTE — Patient Instructions (Signed)
Description   Continue taking 5mg  daily except 2.5mg  on Friday. Recheck INR in 4 weeks.

## 2017-04-19 ENCOUNTER — Ambulatory Visit (INDEPENDENT_AMBULATORY_CARE_PROVIDER_SITE_OTHER): Payer: PPO

## 2017-04-19 DIAGNOSIS — Z5181 Encounter for therapeutic drug level monitoring: Secondary | ICD-10-CM

## 2017-04-19 DIAGNOSIS — Z9889 Other specified postprocedural states: Secondary | ICD-10-CM | POA: Diagnosis not present

## 2017-04-19 DIAGNOSIS — Z952 Presence of prosthetic heart valve: Secondary | ICD-10-CM | POA: Diagnosis not present

## 2017-04-19 DIAGNOSIS — I480 Paroxysmal atrial fibrillation: Secondary | ICD-10-CM

## 2017-04-19 LAB — POCT INR: INR: 2.5

## 2017-04-19 NOTE — Patient Instructions (Signed)
Description   Continue taking 5mg  daily except 2.5mg  on Friday. Recheck INR in 6 weeks.

## 2017-04-27 ENCOUNTER — Other Ambulatory Visit: Payer: Self-pay

## 2017-04-27 DIAGNOSIS — I10 Essential (primary) hypertension: Secondary | ICD-10-CM

## 2017-04-27 MED ORDER — TELMISARTAN-HCTZ 80-12.5 MG PO TABS: 1.0000 | ORAL_TABLET | Freq: Every day | ORAL | 1 refills | Status: DC

## 2017-05-07 DIAGNOSIS — I480 Paroxysmal atrial fibrillation: Secondary | ICD-10-CM | POA: Diagnosis not present

## 2017-05-07 DIAGNOSIS — E559 Vitamin D deficiency, unspecified: Secondary | ICD-10-CM | POA: Diagnosis not present

## 2017-05-07 DIAGNOSIS — I2510 Atherosclerotic heart disease of native coronary artery without angina pectoris: Secondary | ICD-10-CM | POA: Diagnosis not present

## 2017-05-07 DIAGNOSIS — R5383 Other fatigue: Secondary | ICD-10-CM | POA: Diagnosis not present

## 2017-05-07 DIAGNOSIS — Z79899 Other long term (current) drug therapy: Secondary | ICD-10-CM | POA: Diagnosis not present

## 2017-05-07 DIAGNOSIS — R5381 Other malaise: Secondary | ICD-10-CM | POA: Diagnosis not present

## 2017-05-07 DIAGNOSIS — Z125 Encounter for screening for malignant neoplasm of prostate: Secondary | ICD-10-CM | POA: Diagnosis not present

## 2017-05-07 DIAGNOSIS — I10 Essential (primary) hypertension: Secondary | ICD-10-CM | POA: Diagnosis not present

## 2017-05-07 DIAGNOSIS — E782 Mixed hyperlipidemia: Secondary | ICD-10-CM | POA: Diagnosis not present

## 2017-05-20 ENCOUNTER — Other Ambulatory Visit: Payer: Self-pay | Admitting: Cardiology

## 2017-05-20 NOTE — Telephone Encounter (Signed)
Fax received to refill amlodipine 5mg , rosuvastatin 20mg  , and jantoven 5mg  to be sent to [facility].  Caryl Pina will send Rx for Erik Montgomery as this is managed by her in our coumadin clinic

## 2017-05-31 ENCOUNTER — Ambulatory Visit (INDEPENDENT_AMBULATORY_CARE_PROVIDER_SITE_OTHER): Payer: PPO

## 2017-05-31 DIAGNOSIS — Z5181 Encounter for therapeutic drug level monitoring: Secondary | ICD-10-CM

## 2017-05-31 DIAGNOSIS — Z952 Presence of prosthetic heart valve: Secondary | ICD-10-CM | POA: Diagnosis not present

## 2017-05-31 DIAGNOSIS — I480 Paroxysmal atrial fibrillation: Secondary | ICD-10-CM

## 2017-05-31 DIAGNOSIS — Z9889 Other specified postprocedural states: Secondary | ICD-10-CM | POA: Diagnosis not present

## 2017-05-31 LAB — POCT INR: INR: 1.6

## 2017-05-31 NOTE — Patient Instructions (Signed)
Description   Take 10 mg today (2 tablets) then continue taking 5mg  daily except 2.5mg  on Friday. Recheck INR in 2 weeks.

## 2017-06-07 DIAGNOSIS — D61818 Other pancytopenia: Secondary | ICD-10-CM | POA: Diagnosis not present

## 2017-06-20 NOTE — Progress Notes (Signed)
Cardiology Office Note:    Date:  06/21/2017   ID:  Erik Montgomery, DOB 05-31-1940, MRN [id number]  PCP:  Myrlene Broker, MD  Cardiologist:  Shirlee More, MD    Referring MD: Myrlene Broker, MD    ASSESSMENT:    1. S/P AVR   2. Long term (current) use of anticoagulants   3. PAF (paroxysmal atrial fibrillation) ([place])   4. Coronary artery disease involving native coronary artery of native heart without angina pectoris   5. Mixed dyslipidemia   6. Pancytopenia ([place])    PLAN:    In order of problems listed above:  1. Stable clinically his valve is not in question and does not require cardiac imaging.  Continue warfarin goal INR 2.5 2. Stable continue warfarin managed clinic 3. Stable no clinical recurrence he is in sinus rhythm today 4. Stable having no anginal discomfort continue medical treatment this time does not require an ischemia evaluation 5. Stable continue with statin 6. New finding he has had 2 white counts that are above 50% of normal with mild decrease in platelets and hemoglobin previously was on B12 supplements and is waiting for advice from his PCP.  I told him he may perhaps require hematology evaluation he is not systemically sick has not had weight loss fatigue fever or diaphoresis   Next appointment: 6 months   Medication Adjustments/Labs and Tests Ordered: Current medicines are reviewed at length with the patient today.  Concerns regarding medicines are outlined above.  No orders of the defined types were placed in this encounter.  No orders of the defined types were placed in this encounter.   Chief Complaint  Patient presents with  . Follow-up  . Atrial Fibrillation    History of Present Illness:    Erik Montgomery is a 77 y.o. male with a hx of CAD, Atrial Fibrillation, Dyslipidemia, HTN, S/P CABG with a no obstructive coronary stenosis and patent SVG to RCA at cath 2009 , Valvular heart disease with MV repair and a St Jude AVR in 2006  on warfarin last seen 10/26/16.  ASSESSMENT:    10/26/16   1. S/P AVR   2. PAF (paroxysmal atrial fibrillation) ([place])   3. H/O mitral valve repair   4. Long term (current) use of anticoagulants   5. Essential hypertension   6. Coronary artery disease involving native coronary artery of native heart without angina pectoris   7. Mixed dyslipidemia    PLAN:    In order of problems listed above:  1. Stable continue warfarin goal INR 2.5. At this time I do not think he requires an echo cardiogram. 2. Stable, he has had no clinical recurrence 3. Stable no evidence of significant mitral regurgitation 4. Stable continue warfarin goal INR 2.5 5. Not at target, he will stop Ace start ARB diuretic and reduce amlodipine with edema. Follow-up 2 week renal function potassium and contact me if systolics remain greater than 140 6. Stable continue current medical treatment I do not think he requires an ischemia evaluation at this time 7. Stable continue his statin check liver function and lipids in 2 weeks    Compliance with diet, lifestyle and medications: Yes  He remains active golfs frequently has no exercise intolerance chest pain dyspnea palpitation or syncope.  He is pleased with the quality of his life compliant with warfarin managed in our anticoagulant program and has had no bleeding complication Past Medical History:  Diagnosis Date  . Chronic right shoulder  pain 02/28/2015  . Coronary artery disease involving native coronary artery of native heart without angina pectoris 04/25/2015   Overview:  Patent  SVG to RCA at cath 2009  . Essential hypertension 09/02/2016  . H/O mitral valve repair 04/25/2015  . Heart valve replaced 10/12/2016  . Hx of CABG 04/25/2015  . Long term (current) use of anticoagulants 10/12/2016  . Mixed dyslipidemia 09/02/2016  . Mood disorder ([place]) 09/02/2016  . S/P arthroscopy of shoulder 05/20/2015    Past Surgical History:  Procedure Laterality Date  . CARDIAC  VALVE REPLACEMENT      Current Medications: Current Meds  Medication Sig  . amLODipine (NORVASC) 5 MG tablet Take 1 tablet by mouth daily.  Marland Kitchen amoxicillin (AMOXIL) 500 MG tablet Take 4 tablets (2,000 mg total) by mouth as directed. Take 4 tablets 30 mins prior to dental procedure.  . Glucos-Chondroit-Hyaluron-MSM (GLUCOSAMINE CHONDROITIN JOINT PO) Take 1 tablet by mouth daily.  . nitroGLYCERIN (NITROSTAT) 0.4 MG SL tablet Place 1 tablet (0.4 mg total) under the tongue every five (5) minutes as needed for chest pain.  . Omega-3 Fatty Acids (FISH OIL) 1000 MG CPDR Take 1 capsule by mouth daily.  . rosuvastatin (CRESTOR) 20 MG tablet Take 1 tablet by mouth daily  . telmisartan-hydrochlorothiazide (MICARDIS HCT) 80-12.5 MG tablet Take 1 tablet by mouth daily.  . [DISCONTINUED] warfarin (JANTOVEN) 5 MG tablet As directed by coumadin clinic     Allergies:   Patient has no known allergies.   Social History   Socioeconomic History  . Marital status: Single    Spouse name: Not on file  . Number of children: Not on file  . Years of education: Not on file  . Highest education level: Not on file  Occupational History  . Not on file  Social Needs  . Financial resource strain: Not on file  . Food insecurity:    Worry: Not on file    Inability: Not on file  . Transportation needs:    Medical: Not on file    Non-medical: Not on file  Tobacco Use  . Smoking status: Never Smoker  . Smokeless tobacco: Never Used  Substance and Sexual Activity  . Alcohol use: No  . Drug use: No  . Sexual activity: Not on file  Lifestyle  . Physical activity:    Days per week: Not on file    Minutes per session: Not on file  . Stress: Not on file  Relationships  . Social connections:    Talks on phone: Not on file    Gets together: Not on file    Attends religious service: Not on file    Active member of club or organization: Not on file    Attends meetings of clubs or organizations: Not on file     Relationship status: Not on file  Other Topics Concern  . Not on file  Social History Narrative  . Not on file     Family History: The patient's family history includes Cancer in his father; Heart disease in his brother; Ovarian cancer in his mother. ROS:   Please see the history of present illness.    All other systems reviewed and are negative.  EKGs/Labs/Other Studies Reviewed:    The following studies were reviewed today:  EKG:  EKG ordered today.  The ekg ordered today demonstrates sinus rhythm normal  Recent Labs:  Component     Latest Ref Rng & Units 10/26/2016 11/09/2016 12/07/2016 12/21/2016  INR  2.9 2.0 1.7 (H) 1.9   Component     Latest Ref Rng & Units 01/18/2017 02/01/2017 02/22/2017 03/22/2017  INR      1.9 2.6 2.6 2.7   Component     Latest Ref Rng & Units 04/19/2017  INR      2.5   05/07/17: CMP is normal, Chol 135 HDL 41 LDL 80 Recent Lipid Panel    Component Value Date/Time   CHOL 137 11/04/2016 0845   TRIG 109 11/04/2016 0845   HDL 40 11/04/2016 0845   CHOLHDL 3.4 11/04/2016 0845   LDLCALC 75 11/04/2016 0845    Physical Exam:    VS:  BP (!) 142/78 (BP Location: Right Arm, Patient Position: Sitting, Cuff Size: Normal)   Pulse 60   Ht 5\' 9"  (1.753 m)   Wt 158 lb 12.8 oz (72 kg)   SpO2 98%   BMI 23.45 kg/m     Wt Readings from Last 3 Encounters:  06/21/17 158 lb 12.8 oz (72 kg)  10/26/16 162 lb (73.5 kg)     GEN:  Well nourished, well developed in no acute distress HEENT: Normal NECK: No JVD; No carotid bruits LYMPHATICS: No lymphadenopathy CARDIAC: Sharp closing sound is seen Jude aortic valve no murmur RRR, no murmurs, rubs, gallops RESPIRATORY:  Clear to auscultation without rales, wheezing or rhonchi  ABDOMEN: Soft, non-tender, non-distended MUSCULOSKELETAL:  No edema; No deformity  SKIN: Warm and dry NEUROLOGIC:  Alert and oriented x 3 PSYCHIATRIC:  Normal affect    Signed, Shirlee More, MD  06/21/2017 11:54 AM    [place]

## 2017-06-21 ENCOUNTER — Ambulatory Visit (INDEPENDENT_AMBULATORY_CARE_PROVIDER_SITE_OTHER): Payer: PPO | Admitting: Cardiology

## 2017-06-21 ENCOUNTER — Encounter: Payer: Self-pay | Admitting: *Deleted

## 2017-06-21 ENCOUNTER — Ambulatory Visit (INDEPENDENT_AMBULATORY_CARE_PROVIDER_SITE_OTHER): Payer: PPO

## 2017-06-21 ENCOUNTER — Encounter: Payer: Self-pay | Admitting: Cardiology

## 2017-06-21 ENCOUNTER — Other Ambulatory Visit: Payer: Self-pay

## 2017-06-21 VITALS — BP 142/78 | HR 60 | Ht 69.0 in | Wt 158.8 lb

## 2017-06-21 DIAGNOSIS — I2510 Atherosclerotic heart disease of native coronary artery without angina pectoris: Secondary | ICD-10-CM | POA: Diagnosis not present

## 2017-06-21 DIAGNOSIS — Z9889 Other specified postprocedural states: Secondary | ICD-10-CM | POA: Diagnosis not present

## 2017-06-21 DIAGNOSIS — Z7901 Long term (current) use of anticoagulants: Secondary | ICD-10-CM

## 2017-06-21 DIAGNOSIS — Z952 Presence of prosthetic heart valve: Secondary | ICD-10-CM

## 2017-06-21 DIAGNOSIS — E782 Mixed hyperlipidemia: Secondary | ICD-10-CM

## 2017-06-21 DIAGNOSIS — I480 Paroxysmal atrial fibrillation: Secondary | ICD-10-CM

## 2017-06-21 DIAGNOSIS — D61818 Other pancytopenia: Secondary | ICD-10-CM

## 2017-06-21 DIAGNOSIS — Z5181 Encounter for therapeutic drug level monitoring: Secondary | ICD-10-CM

## 2017-06-21 LAB — POCT INR: INR: 2.5 (ref 2.0–3.0)

## 2017-06-21 MED ORDER — JANTOVEN 5 MG PO TABS: ORAL_TABLET | ORAL | 1 refills | Status: DC

## 2017-06-21 NOTE — Patient Instructions (Signed)

## 2017-06-21 NOTE — Patient Instructions (Signed)
Description   Continue taking 5mg  daily except 2.5mg  on Friday. Recheck INR in 3 weeks. Be sure to keep your intake of high vitamin K foods consistent on a weekly basis. Call coumadin clinic with any medication changes [phone].

## 2017-06-21 NOTE — Addendum Note (Signed)
Addended by: Jerl Santos R on: 06/21/2017 02:40 PM   Modules accepted: Orders

## 2017-07-13 ENCOUNTER — Ambulatory Visit (INDEPENDENT_AMBULATORY_CARE_PROVIDER_SITE_OTHER): Payer: PPO

## 2017-07-13 DIAGNOSIS — Z952 Presence of prosthetic heart valve: Secondary | ICD-10-CM | POA: Diagnosis not present

## 2017-07-13 DIAGNOSIS — I480 Paroxysmal atrial fibrillation: Secondary | ICD-10-CM

## 2017-07-13 DIAGNOSIS — Z5181 Encounter for therapeutic drug level monitoring: Secondary | ICD-10-CM

## 2017-07-13 DIAGNOSIS — Z9889 Other specified postprocedural states: Secondary | ICD-10-CM | POA: Diagnosis not present

## 2017-07-13 LAB — POCT INR: INR: 2.5 (ref 2.0–3.0)

## 2017-07-13 NOTE — Patient Instructions (Signed)
Description   Continue taking 5mg  daily except 2.5mg  on Friday. Recheck INR in 4 weeks. Be sure to keep your intake of high vitamin K foods consistent on a weekly basis. Call coumadin clinic with any medication changes [phone].

## 2017-07-15 DIAGNOSIS — D472 Monoclonal gammopathy: Secondary | ICD-10-CM | POA: Diagnosis not present

## 2017-07-15 DIAGNOSIS — D72819 Decreased white blood cell count, unspecified: Secondary | ICD-10-CM | POA: Diagnosis not present

## 2017-07-15 DIAGNOSIS — Z952 Presence of prosthetic heart valve: Secondary | ICD-10-CM | POA: Diagnosis not present

## 2017-07-15 DIAGNOSIS — D649 Anemia, unspecified: Secondary | ICD-10-CM | POA: Diagnosis not present

## 2017-07-29 DIAGNOSIS — H524 Presbyopia: Secondary | ICD-10-CM | POA: Diagnosis not present

## 2017-07-29 DIAGNOSIS — H2513 Age-related nuclear cataract, bilateral: Secondary | ICD-10-CM | POA: Diagnosis not present

## 2017-08-10 DIAGNOSIS — D72819 Decreased white blood cell count, unspecified: Secondary | ICD-10-CM | POA: Diagnosis not present

## 2017-08-10 DIAGNOSIS — D472 Monoclonal gammopathy: Secondary | ICD-10-CM | POA: Diagnosis not present

## 2017-08-11 ENCOUNTER — Ambulatory Visit (INDEPENDENT_AMBULATORY_CARE_PROVIDER_SITE_OTHER): Payer: PPO

## 2017-08-11 ENCOUNTER — Other Ambulatory Visit: Payer: Self-pay | Admitting: Cardiology

## 2017-08-11 DIAGNOSIS — Z9889 Other specified postprocedural states: Secondary | ICD-10-CM | POA: Diagnosis not present

## 2017-08-11 DIAGNOSIS — I480 Paroxysmal atrial fibrillation: Secondary | ICD-10-CM | POA: Diagnosis not present

## 2017-08-11 DIAGNOSIS — Z952 Presence of prosthetic heart valve: Secondary | ICD-10-CM

## 2017-08-11 DIAGNOSIS — Z5181 Encounter for therapeutic drug level monitoring: Secondary | ICD-10-CM

## 2017-08-11 LAB — POCT INR: INR: 3 (ref 2.0–3.0)

## 2017-08-11 NOTE — Patient Instructions (Signed)
Description   Continue taking 5mg  daily except 2.5mg  on Friday. Recheck INR in 5 weeks. Be sure to keep your intake of high vitamin K foods consistent on a weekly basis. Call coumadin clinic with any medication changes [phone].

## 2017-09-15 ENCOUNTER — Ambulatory Visit (INDEPENDENT_AMBULATORY_CARE_PROVIDER_SITE_OTHER): Payer: PPO

## 2017-09-15 DIAGNOSIS — Z952 Presence of prosthetic heart valve: Secondary | ICD-10-CM | POA: Diagnosis not present

## 2017-09-15 DIAGNOSIS — Z5181 Encounter for therapeutic drug level monitoring: Secondary | ICD-10-CM

## 2017-09-15 DIAGNOSIS — I480 Paroxysmal atrial fibrillation: Secondary | ICD-10-CM

## 2017-09-15 DIAGNOSIS — Z9889 Other specified postprocedural states: Secondary | ICD-10-CM | POA: Diagnosis not present

## 2017-09-15 LAB — POCT INR: INR: 2.7 (ref 2.0–3.0)

## 2017-09-15 NOTE — Patient Instructions (Signed)
Description   Continue taking 5mg daily except 2.5mg on Friday. Recheck INR in 6 weeks. Be sure to keep your intake of high vitamin K foods consistent on a weekly basis. Call coumadin clinic with any medication changes [phone].     

## 2017-09-20 DIAGNOSIS — I10 Essential (primary) hypertension: Secondary | ICD-10-CM | POA: Diagnosis not present

## 2017-09-20 DIAGNOSIS — G4700 Insomnia, unspecified: Secondary | ICD-10-CM | POA: Diagnosis not present

## 2017-10-19 DIAGNOSIS — H25043 Posterior subcapsular polar age-related cataract, bilateral: Secondary | ICD-10-CM | POA: Diagnosis not present

## 2017-10-19 DIAGNOSIS — H18413 Arcus senilis, bilateral: Secondary | ICD-10-CM | POA: Diagnosis not present

## 2017-10-19 DIAGNOSIS — H02831 Dermatochalasis of right upper eyelid: Secondary | ICD-10-CM | POA: Diagnosis not present

## 2017-10-19 DIAGNOSIS — H2513 Age-related nuclear cataract, bilateral: Secondary | ICD-10-CM | POA: Diagnosis not present

## 2017-10-19 DIAGNOSIS — H2512 Age-related nuclear cataract, left eye: Secondary | ICD-10-CM | POA: Diagnosis not present

## 2017-10-19 DIAGNOSIS — H25013 Cortical age-related cataract, bilateral: Secondary | ICD-10-CM | POA: Diagnosis not present

## 2017-10-23 DIAGNOSIS — Z23 Encounter for immunization: Secondary | ICD-10-CM | POA: Diagnosis not present

## 2017-10-27 ENCOUNTER — Ambulatory Visit (INDEPENDENT_AMBULATORY_CARE_PROVIDER_SITE_OTHER): Payer: PPO

## 2017-10-27 DIAGNOSIS — Z952 Presence of prosthetic heart valve: Secondary | ICD-10-CM

## 2017-10-27 DIAGNOSIS — I480 Paroxysmal atrial fibrillation: Secondary | ICD-10-CM

## 2017-10-27 DIAGNOSIS — Z5181 Encounter for therapeutic drug level monitoring: Secondary | ICD-10-CM

## 2017-10-27 DIAGNOSIS — Z9889 Other specified postprocedural states: Secondary | ICD-10-CM | POA: Diagnosis not present

## 2017-10-27 LAB — POCT INR: INR: 2.9 (ref 2.0–3.0)

## 2017-10-27 NOTE — Patient Instructions (Signed)
Description   Continue taking 5mg daily except 2.5mg on Friday. Recheck INR in 6 weeks. Be sure to keep your intake of high vitamin K foods consistent on a weekly basis. Call coumadin clinic with any medication changes [phone].     

## 2017-11-15 ENCOUNTER — Other Ambulatory Visit: Payer: Self-pay | Admitting: Cardiology

## 2017-11-15 DIAGNOSIS — I10 Essential (primary) hypertension: Secondary | ICD-10-CM

## 2017-12-06 DIAGNOSIS — H2512 Age-related nuclear cataract, left eye: Secondary | ICD-10-CM | POA: Diagnosis not present

## 2017-12-07 DIAGNOSIS — H2511 Age-related nuclear cataract, right eye: Secondary | ICD-10-CM | POA: Diagnosis not present

## 2017-12-08 ENCOUNTER — Ambulatory Visit (INDEPENDENT_AMBULATORY_CARE_PROVIDER_SITE_OTHER): Payer: PPO

## 2017-12-08 DIAGNOSIS — Z5181 Encounter for therapeutic drug level monitoring: Secondary | ICD-10-CM | POA: Diagnosis not present

## 2017-12-08 DIAGNOSIS — I480 Paroxysmal atrial fibrillation: Secondary | ICD-10-CM | POA: Diagnosis not present

## 2017-12-08 DIAGNOSIS — Z9889 Other specified postprocedural states: Secondary | ICD-10-CM | POA: Diagnosis not present

## 2017-12-08 DIAGNOSIS — D61818 Other pancytopenia: Secondary | ICD-10-CM | POA: Diagnosis not present

## 2017-12-08 DIAGNOSIS — Z952 Presence of prosthetic heart valve: Secondary | ICD-10-CM

## 2017-12-08 LAB — POCT INR: INR: 2.6 (ref 2.0–3.0)

## 2017-12-08 NOTE — Patient Instructions (Signed)
Description   Continue taking 5mg daily except 2.5mg on Friday. Recheck INR in 6 weeks. Be sure to keep your intake of high vitamin K foods consistent on a weekly basis. Call coumadin clinic with any medication changes [phone].     

## 2017-12-15 DIAGNOSIS — D61818 Other pancytopenia: Secondary | ICD-10-CM | POA: Diagnosis not present

## 2017-12-15 DIAGNOSIS — D472 Monoclonal gammopathy: Secondary | ICD-10-CM | POA: Diagnosis not present

## 2017-12-15 DIAGNOSIS — Z832 Family history of diseases of the blood and blood-forming organs and certain disorders involving the immune mechanism: Secondary | ICD-10-CM

## 2017-12-15 DIAGNOSIS — Z952 Presence of prosthetic heart valve: Secondary | ICD-10-CM | POA: Diagnosis not present

## 2017-12-15 DIAGNOSIS — D589 Hereditary hemolytic anemia, unspecified: Secondary | ICD-10-CM | POA: Diagnosis not present

## 2017-12-20 DIAGNOSIS — H2511 Age-related nuclear cataract, right eye: Secondary | ICD-10-CM | POA: Diagnosis not present

## 2018-01-17 NOTE — Progress Notes (Signed)
Cardiology Office Note:    Date:  01/19/2018   ID:  [name], DOB 1941/01/26, MRN [id number]  PCP:  Myrlene Broker, MD  Cardiologist:  Shirlee More, MD    Referring MD: Myrlene Broker, MD    ASSESSMENT:    1. Coronary artery disease involving native coronary artery of native heart without angina pectoris   2. S/P AVR   3. Long term (current) use of anticoagulants   4. Essential hypertension   5. Mixed dyslipidemia   6. H/O mitral valve repair    PLAN:    In order of problems listed above:  1. Stable CAD he has no anginal discomfort I continue medical treatment including his anticoagulant high intensity statin at this time I do not think he requires an ischemia evaluation 2. Clinically appears stable his physical exam does not suggest paravalvular leak or valvular dysfunction and a Saint Jude valve in general is not called into question but with his anemia?  Hemolysis echocardiogram will be performed.  I suspect normal valvular function and parameters. 3. Continue warfarin goal INR 2.5 4. Stable continue treatment including his ARB 5. Stable continue statin his lipids are ideal 6. Stable clinically no murmur of mitral regurgitation check echocardiogram   Next appointment: 6 months   Medication Adjustments/Labs and Tests Ordered: Current medicines are reviewed at length with the patient today.  Concerns regarding medicines are outlined above.  No orders of the defined types were placed in this encounter.  No orders of the defined types were placed in this encounter.   Chief Complaint  Patient presents with  . Follow-up    with CABG and St Jude AVR  . Hypertension  . Hyperlipidemia  . Atrial Fibrillation    History of Present Illness:    Erik Montgomery is a 77 y.o. male with a hx of CAD, Atrial Fibrillation, Dyslipidemia, HTN, S/P CABG with a no obstructive coronary stenosis and patent SVG to RCA at cath 2009 , Valvular heart disease with MV repair  and a St Jude AVR in 2006 on warfarin  last seen 06/21/17. Compliance with diet, lifestyle and medications: Yes  In the interim is had bilateral cataracts with good result.  He was found to be anemic he had significant macrocytosis was referred to hematology Dr. Hinton Rao unfortunately cannot access those records this morning he shows me a note that his white count was diminished 3500 elite hemoglobin 11.2 and a handwritten note by the physician questioning whether he has hemolysis from his valve and had a monoclonal gammopathy benign variant.  He is feeling well has had no weight loss or fever or night sweats he has no exercise intolerance edema shortness of breath chest pain palpitation or syncope he continues to golf on a regular basis his warfarin is managed in my practice with the warfarin clinic and pharmacy PhD.  I told him it is rare that people can have valve hemolysis typically it is in settings of perivalvular leaks he is not having symptoms his physical exam does not show aortic regurgitation but in order to evaluate closer especially this handwritten note questioning valve hemolysis he will undergo echocardiogram.  I am trying to access those records from [facility] we cannot pull up into the system at this time. Past Medical History:  Diagnosis Date  . Chronic right shoulder pain 02/28/2015  . Coronary artery disease involving native coronary artery of native heart without angina pectoris 04/25/2015   Overview:  Patent  SVG  to RCA at cath 2009  . Essential hypertension 09/02/2016  . H/O mitral valve repair 04/25/2015  . Heart valve replaced 10/12/2016  . Hx of CABG 04/25/2015  . Long term (current) use of anticoagulants 10/12/2016  . Mixed dyslipidemia 09/02/2016  . Mood disorder ([place]) 09/02/2016  . S/P arthroscopy of shoulder 05/20/2015    Past Surgical History:  Procedure Laterality Date  . CARDIAC VALVE REPLACEMENT    . CATARACT EXTRACTION, BILATERAL      Current  Medications: Current Meds  Medication Sig  . amLODipine (NORVASC) 5 MG tablet Take 1 tablet by mouth daily.  Marland Kitchen amoxicillin (AMOXIL) 500 MG tablet Take 4 tablets (2,000 mg total) by mouth as directed. Take 4 tablets 30 mins prior to dental procedure.  . Glucos-Chondroit-Hyaluron-MSM (GLUCOSAMINE CHONDROITIN JOINT PO) Take 1 tablet by mouth daily.  Marland Kitchen JANTOVEN 5 MG tablet As directed by coumadin clinic  . nitroGLYCERIN (NITROSTAT) 0.4 MG SL tablet Place 1 tablet (0.4 mg total) under the tongue every five (5) minutes as needed for chest pain.  . Omega-3 Fatty Acids (FISH OIL) 1000 MG CPDR Take 1 capsule by mouth daily.  . rosuvastatin (CRESTOR) 20 MG tablet Take 1 tablet by mouth daily  . telmisartan-hydrochlorothiazide (MICARDIS HCT) 80-12.5 MG tablet TAKE ONE TABLET BY MOUTH DAILY     Allergies:   Patient has no known allergies.   Social History   Socioeconomic History  . Marital status: Single    Spouse name: Not on file  . Number of children: Not on file  . Years of education: Not on file  . Highest education level: Not on file  Occupational History  . Not on file  Social Needs  . Financial resource strain: Not on file  . Food insecurity:    Worry: Not on file    Inability: Not on file  . Transportation needs:    Medical: Not on file    Non-medical: Not on file  Tobacco Use  . Smoking status: Never Smoker  . Smokeless tobacco: Never Used  Substance and Sexual Activity  . Alcohol use: No  . Drug use: No  . Sexual activity: Not on file  Lifestyle  . Physical activity:    Days per week: Not on file    Minutes per session: Not on file  . Stress: Not on file  Relationships  . Social connections:    Talks on phone: Not on file    Gets together: Not on file    Attends religious service: Not on file    Active member of club or organization: Not on file    Attends meetings of clubs or organizations: Not on file    Relationship status: Not on file  Other Topics Concern  .  Not on file  Social History Narrative  . Not on file     Family History: The patient's family history includes Cancer in his father; Heart disease in his brother; Ovarian cancer in his mother. ROS:   Please see the history of present illness.    All other systems reviewed and are negative.  EKGs/Labs/Other Studies Reviewed:    The following studies were reviewed today:  EKG:  EKG May 2019 sinus rhythm and normal  Recent Labs:   05/07/2017 CMP is normal creatinine 1.24 cholesterol 135 LDL 80 HDL 41 No results found for requested labs within last 8760 hours.  Recent Lipid Panel    Component Value Date/Time   CHOL 137 11/04/2016 0845   TRIG 109  11/04/2016 0845   HDL 40 11/04/2016 0845   CHOLHDL 3.4 11/04/2016 0845   LDLCALC 75 11/04/2016 0845    Physical Exam:    VS:  BP 140/60 (BP Location: Left Arm, Patient Position: Sitting, Cuff Size: Normal)   Pulse 60   Ht 5\' 9"  (1.753 m)   Wt 160 lb (72.6 kg)   SpO2 98%   BMI 23.63 kg/m     Wt Readings from Last 3 Encounters:  01/19/18 160 lb (72.6 kg)  06/21/17 158 lb 12.8 oz (72 kg)  10/26/16 162 lb (73.5 kg)     GEN:  Well nourished, well developed in no acute distress HEENT: Normal NECK: No JVD; No carotid bruits LYMPHATICS: No lymphadenopathy CARDIAC: sharp CS 1/6 SEM no AR RRR,  rubs, gallops RESPIRATORY:  Clear to auscultation without rales, wheezing or rhonchi  ABDOMEN: Soft, non-tender, non-distended MUSCULOSKELETAL:  No edema; No deformity  SKIN: Warm and dry NEUROLOGIC:  Alert and oriented x 3 PSYCHIATRIC:  Normal affect    Signed, Shirlee More, MD  01/19/2018 8:28 AM    [place]

## 2018-01-19 ENCOUNTER — Encounter: Payer: Self-pay | Admitting: Cardiology

## 2018-01-19 ENCOUNTER — Ambulatory Visit (INDEPENDENT_AMBULATORY_CARE_PROVIDER_SITE_OTHER): Payer: PPO | Admitting: Cardiology

## 2018-01-19 VITALS — BP 140/60 | HR 60 | Ht 69.0 in | Wt 160.0 lb

## 2018-01-19 DIAGNOSIS — E782 Mixed hyperlipidemia: Secondary | ICD-10-CM | POA: Diagnosis not present

## 2018-01-19 DIAGNOSIS — I10 Essential (primary) hypertension: Secondary | ICD-10-CM | POA: Diagnosis not present

## 2018-01-19 DIAGNOSIS — Z7901 Long term (current) use of anticoagulants: Secondary | ICD-10-CM

## 2018-01-19 DIAGNOSIS — I2510 Atherosclerotic heart disease of native coronary artery without angina pectoris: Secondary | ICD-10-CM

## 2018-01-19 DIAGNOSIS — Z952 Presence of prosthetic heart valve: Secondary | ICD-10-CM | POA: Diagnosis not present

## 2018-01-19 DIAGNOSIS — Z9889 Other specified postprocedural states: Secondary | ICD-10-CM

## 2018-01-19 NOTE — Patient Instructions (Signed)
Medication Instructions:  Your physician recommends that you continue on your current medications as directed. Please refer to the Current Medication list given to you today.  If you need a refill on your cardiac medications before your next appointment, please call your pharmacy.   Lab work: NONE If you have labs (blood work) drawn today and your tests are completely normal, you will receive your results only by: Marland Kitchen MyChart Message (if you have MyChart) OR . A paper copy in the mail If you have any lab test that is abnormal or we need to change your treatment, we will call you to review the results.  Testing/Procedures: Your physician has requested that you have an echocardiogram. Echocardiography is a painless test that uses sound waves to create images of your heart. It provides your doctor with information about the size and shape of your heart and how well your heart's chambers and valves are working. This procedure takes approximately one hour. There are no restrictions for this procedure.    Follow-Up: At [facility], you and your health needs are our priority.  As part of our continuing mission to provide you with exceptional heart care, we have created designated Provider Care Teams.  These Care Teams include your primary Cardiologist (physician) and Advanced Practice Providers (APPs -  Physician Assistants and Nurse Practitioners) who all work together to provide you with the care you need, when you need it. You will need a follow up appointment in 6 months.  Please call our office 2 months in advance to schedule this appointment.

## 2018-01-20 ENCOUNTER — Ambulatory Visit (INDEPENDENT_AMBULATORY_CARE_PROVIDER_SITE_OTHER): Payer: PPO | Admitting: Pharmacist

## 2018-01-20 DIAGNOSIS — Z952 Presence of prosthetic heart valve: Secondary | ICD-10-CM

## 2018-01-20 DIAGNOSIS — Z5181 Encounter for therapeutic drug level monitoring: Secondary | ICD-10-CM

## 2018-01-20 DIAGNOSIS — Z9889 Other specified postprocedural states: Secondary | ICD-10-CM

## 2018-01-20 DIAGNOSIS — I480 Paroxysmal atrial fibrillation: Secondary | ICD-10-CM

## 2018-01-20 LAB — POCT INR: INR: 2.9 (ref 2.0–3.0)

## 2018-01-20 NOTE — Patient Instructions (Signed)
Description   Continue taking 5mg  daily except 2.5mg  on Friday. Recheck INR in 6 weeks. Be sure to keep your intake of high vitamin K foods consistent on a weekly basis. Call coumadin clinic with any medication changes [phone].

## 2018-02-23 ENCOUNTER — Other Ambulatory Visit: Payer: PPO

## 2018-03-01 ENCOUNTER — Ambulatory Visit (INDEPENDENT_AMBULATORY_CARE_PROVIDER_SITE_OTHER): Payer: PPO | Admitting: *Deleted

## 2018-03-01 DIAGNOSIS — Z952 Presence of prosthetic heart valve: Secondary | ICD-10-CM

## 2018-03-01 DIAGNOSIS — Z9889 Other specified postprocedural states: Secondary | ICD-10-CM

## 2018-03-01 DIAGNOSIS — I480 Paroxysmal atrial fibrillation: Secondary | ICD-10-CM | POA: Diagnosis not present

## 2018-03-01 DIAGNOSIS — Z5181 Encounter for therapeutic drug level monitoring: Secondary | ICD-10-CM

## 2018-03-01 LAB — POCT INR: INR: 3 (ref 2.0–3.0)

## 2018-03-01 MED ORDER — JANTOVEN 5 MG PO TABS: ORAL_TABLET | ORAL | 1 refills | Status: DC

## 2018-03-01 NOTE — Patient Instructions (Signed)
Description   Continue taking 5mg  daily except 2.5mg  on Friday. Recheck INR in 6 weeks. Be sure to keep your intake of high vitamin K foods consistent on a weekly basis. Call coumadin clinic with any medication changes [phone].

## 2018-03-02 ENCOUNTER — Other Ambulatory Visit: Payer: PPO

## 2018-04-12 ENCOUNTER — Other Ambulatory Visit: Payer: Self-pay | Admitting: Cardiology

## 2018-04-12 ENCOUNTER — Ambulatory Visit (INDEPENDENT_AMBULATORY_CARE_PROVIDER_SITE_OTHER): Payer: PPO | Admitting: *Deleted

## 2018-04-12 DIAGNOSIS — I480 Paroxysmal atrial fibrillation: Secondary | ICD-10-CM

## 2018-04-12 DIAGNOSIS — Z5181 Encounter for therapeutic drug level monitoring: Secondary | ICD-10-CM

## 2018-04-12 DIAGNOSIS — Z9889 Other specified postprocedural states: Secondary | ICD-10-CM

## 2018-04-12 DIAGNOSIS — Z952 Presence of prosthetic heart valve: Secondary | ICD-10-CM

## 2018-04-12 LAB — POCT INR: INR: 3.3 — AB (ref 2.0–3.0)

## 2018-04-12 MED ORDER — AMOXICILLIN 500 MG PO TABS: 2000.0000 mg | ORAL_TABLET | ORAL | 3 refills | Status: DC

## 2018-04-12 NOTE — Patient Instructions (Signed)
Description   Today take 2.5mg  then Continue taking 5mg  daily except 2.5mg  on Friday. Recheck INR in 5 weeks. Be sure to keep your intake of high vitamin K foods consistent on a weekly basis. Call coumadin clinic with any medication changes [phone].

## 2018-05-12 ENCOUNTER — Other Ambulatory Visit: Payer: Self-pay | Admitting: Cardiology

## 2018-05-12 DIAGNOSIS — I10 Essential (primary) hypertension: Secondary | ICD-10-CM

## 2018-05-16 ENCOUNTER — Telehealth: Payer: Self-pay

## 2018-05-16 NOTE — Telephone Encounter (Signed)

## 2018-05-17 ENCOUNTER — Ambulatory Visit (INDEPENDENT_AMBULATORY_CARE_PROVIDER_SITE_OTHER): Payer: PPO | Admitting: *Deleted

## 2018-05-17 ENCOUNTER — Other Ambulatory Visit: Payer: Self-pay

## 2018-05-17 DIAGNOSIS — Z5181 Encounter for therapeutic drug level monitoring: Secondary | ICD-10-CM

## 2018-05-17 DIAGNOSIS — Z9889 Other specified postprocedural states: Secondary | ICD-10-CM | POA: Diagnosis not present

## 2018-05-17 DIAGNOSIS — I480 Paroxysmal atrial fibrillation: Secondary | ICD-10-CM | POA: Diagnosis not present

## 2018-05-17 DIAGNOSIS — E039 Hypothyroidism, unspecified: Secondary | ICD-10-CM | POA: Diagnosis not present

## 2018-05-17 DIAGNOSIS — Z952 Presence of prosthetic heart valve: Secondary | ICD-10-CM | POA: Diagnosis not present

## 2018-05-17 DIAGNOSIS — F419 Anxiety disorder, unspecified: Secondary | ICD-10-CM | POA: Diagnosis not present

## 2018-05-17 DIAGNOSIS — I5043 Acute on chronic combined systolic (congestive) and diastolic (congestive) heart failure: Secondary | ICD-10-CM | POA: Diagnosis not present

## 2018-05-17 DIAGNOSIS — Z006 Encounter for examination for normal comparison and control in clinical research program: Secondary | ICD-10-CM | POA: Diagnosis not present

## 2018-05-17 DIAGNOSIS — E1142 Type 2 diabetes mellitus with diabetic polyneuropathy: Secondary | ICD-10-CM | POA: Diagnosis not present

## 2018-05-17 DIAGNOSIS — E785 Hyperlipidemia, unspecified: Secondary | ICD-10-CM | POA: Diagnosis not present

## 2018-05-17 DIAGNOSIS — I4892 Unspecified atrial flutter: Secondary | ICD-10-CM | POA: Diagnosis not present

## 2018-05-17 DIAGNOSIS — I350 Nonrheumatic aortic (valve) stenosis: Secondary | ICD-10-CM | POA: Diagnosis not present

## 2018-05-17 DIAGNOSIS — Z6841 Body Mass Index (BMI) 40.0 and over, adult: Secondary | ICD-10-CM | POA: Diagnosis not present

## 2018-05-17 DIAGNOSIS — I441 Atrioventricular block, second degree: Secondary | ICD-10-CM | POA: Diagnosis not present

## 2018-05-17 LAB — POCT INR: INR: 3.2 — AB (ref 2.0–3.0)

## 2018-05-17 NOTE — Patient Instructions (Signed)
Description   Spoke with pt and instructed pt to take 2.5mg  today then start taking 5mg  daily except 2.5mg  on Mondays and Fridays. Recheck INR in 4 weeks. Be sure to keep your intake of high vitamin K foods consistent on a weekly basis. Call coumadin clinic with any medication changes [phone].

## 2018-06-13 ENCOUNTER — Telehealth: Payer: Self-pay

## 2018-06-13 NOTE — Telephone Encounter (Signed)

## 2018-06-14 ENCOUNTER — Other Ambulatory Visit: Payer: Self-pay

## 2018-06-14 ENCOUNTER — Ambulatory Visit (INDEPENDENT_AMBULATORY_CARE_PROVIDER_SITE_OTHER): Payer: PPO

## 2018-06-14 DIAGNOSIS — I480 Paroxysmal atrial fibrillation: Secondary | ICD-10-CM | POA: Diagnosis not present

## 2018-06-14 DIAGNOSIS — Z5181 Encounter for therapeutic drug level monitoring: Secondary | ICD-10-CM | POA: Diagnosis not present

## 2018-06-14 DIAGNOSIS — Z952 Presence of prosthetic heart valve: Secondary | ICD-10-CM

## 2018-06-14 DIAGNOSIS — Z9889 Other specified postprocedural states: Secondary | ICD-10-CM | POA: Diagnosis not present

## 2018-06-14 LAB — POCT INR: INR: 2.7 (ref 2.0–3.0)

## 2018-06-14 NOTE — Patient Instructions (Signed)
Description   Spoke with pt and instructed pt to continue on same dosage 5mg  daily except 2.5mg  on Mondays and Fridays. Recheck INR in 4 weeks. Be sure to keep your intake of high vitamin K foods consistent on a weekly basis. Call coumadin clinic with any medication changes [phone].

## 2018-07-07 ENCOUNTER — Telehealth: Payer: Self-pay

## 2018-07-07 NOTE — Telephone Encounter (Signed)

## 2018-07-12 ENCOUNTER — Ambulatory Visit (INDEPENDENT_AMBULATORY_CARE_PROVIDER_SITE_OTHER): Payer: PPO | Admitting: *Deleted

## 2018-07-12 ENCOUNTER — Other Ambulatory Visit: Payer: Self-pay

## 2018-07-12 DIAGNOSIS — Z5181 Encounter for therapeutic drug level monitoring: Secondary | ICD-10-CM | POA: Diagnosis not present

## 2018-07-12 DIAGNOSIS — Z952 Presence of prosthetic heart valve: Secondary | ICD-10-CM

## 2018-07-12 DIAGNOSIS — Z9889 Other specified postprocedural states: Secondary | ICD-10-CM

## 2018-07-12 DIAGNOSIS — I480 Paroxysmal atrial fibrillation: Secondary | ICD-10-CM | POA: Diagnosis not present

## 2018-07-12 LAB — POCT INR: INR: 2.4 (ref 2.0–3.0)

## 2018-07-12 NOTE — Patient Instructions (Signed)
Description   Continue on same dosage 5mg  daily except 2.5mg  on Mondays and Fridays. Recheck INR in 6 weeks. Be sure to keep your intake of high vitamin K foods consistent on a weekly basis. Call coumadin clinic with any medication changes [phone].

## 2018-08-08 ENCOUNTER — Other Ambulatory Visit: Payer: Self-pay | Admitting: Cardiology

## 2018-08-08 NOTE — Telephone Encounter (Addendum)
°*  STAT* If patient is at the pharmacy, call can be transferred to refill team.   1. Which medications need to be refilled? (please list name of each medication and dose if known) JANTOVEN 5 MG tablet   2. Which pharmacy/location (including street and city if local pharmacy) is medication to be sent to?  [facility], [place] [phone] (Phone) [phone] (Fax)     3. Do they need a 30 day or 90 day supply? 90 day

## 2018-08-22 ENCOUNTER — Telehealth: Payer: Self-pay

## 2018-08-22 NOTE — Telephone Encounter (Signed)

## 2018-08-23 ENCOUNTER — Ambulatory Visit (INDEPENDENT_AMBULATORY_CARE_PROVIDER_SITE_OTHER): Payer: PPO | Admitting: *Deleted

## 2018-08-23 ENCOUNTER — Other Ambulatory Visit: Payer: Self-pay

## 2018-08-23 DIAGNOSIS — I480 Paroxysmal atrial fibrillation: Secondary | ICD-10-CM

## 2018-08-23 DIAGNOSIS — Z5181 Encounter for therapeutic drug level monitoring: Secondary | ICD-10-CM

## 2018-08-23 DIAGNOSIS — Z952 Presence of prosthetic heart valve: Secondary | ICD-10-CM

## 2018-08-23 DIAGNOSIS — Z9889 Other specified postprocedural states: Secondary | ICD-10-CM

## 2018-08-23 LAB — POCT INR: INR: 2.9 (ref 2.0–3.0)

## 2018-08-23 NOTE — Patient Instructions (Signed)
Description   Continue on same dosage 5mg  daily except 2.5mg  on Mondays and Fridays. Recheck INR in 6 weeks. Be sure to keep your intake of high vitamin K foods consistent on a weekly basis. Call coumadin clinic with any medication changes [phone].

## 2018-10-04 ENCOUNTER — Ambulatory Visit (INDEPENDENT_AMBULATORY_CARE_PROVIDER_SITE_OTHER): Payer: PPO | Admitting: *Deleted

## 2018-10-04 ENCOUNTER — Other Ambulatory Visit: Payer: Self-pay

## 2018-10-04 DIAGNOSIS — Z5181 Encounter for therapeutic drug level monitoring: Secondary | ICD-10-CM

## 2018-10-04 DIAGNOSIS — Z952 Presence of prosthetic heart valve: Secondary | ICD-10-CM | POA: Diagnosis not present

## 2018-10-04 DIAGNOSIS — Z9889 Other specified postprocedural states: Secondary | ICD-10-CM | POA: Diagnosis not present

## 2018-10-04 DIAGNOSIS — I480 Paroxysmal atrial fibrillation: Secondary | ICD-10-CM

## 2018-10-04 LAB — POCT INR: INR: 3.6 — AB (ref 2.0–3.0)

## 2018-10-04 NOTE — Patient Instructions (Signed)
Description   Skip today's dose, then Continue on same dosage 5mg  daily except 2.5mg  on Mondays and Fridays. Recheck INR in 3 weeks. Be sure to keep your intake of high vitamin K foods consistent on a weekly basis. Call coumadin clinic with any medication changes [phone].

## 2018-10-25 ENCOUNTER — Other Ambulatory Visit: Payer: Self-pay

## 2018-10-25 ENCOUNTER — Ambulatory Visit (INDEPENDENT_AMBULATORY_CARE_PROVIDER_SITE_OTHER): Payer: PPO | Admitting: Pharmacist Clinician (PhC)/ Clinical Pharmacy Specialist

## 2018-10-25 DIAGNOSIS — Z952 Presence of prosthetic heart valve: Secondary | ICD-10-CM

## 2018-10-25 DIAGNOSIS — Z5181 Encounter for therapeutic drug level monitoring: Secondary | ICD-10-CM | POA: Diagnosis not present

## 2018-10-25 DIAGNOSIS — I480 Paroxysmal atrial fibrillation: Secondary | ICD-10-CM | POA: Diagnosis not present

## 2018-10-25 DIAGNOSIS — Z9889 Other specified postprocedural states: Secondary | ICD-10-CM

## 2018-10-25 LAB — POCT INR: INR: 2.4 (ref 2.0–3.0)

## 2018-11-07 ENCOUNTER — Other Ambulatory Visit: Payer: Self-pay | Admitting: Cardiology

## 2018-11-07 DIAGNOSIS — I10 Essential (primary) hypertension: Secondary | ICD-10-CM

## 2018-11-09 DIAGNOSIS — Z23 Encounter for immunization: Secondary | ICD-10-CM | POA: Diagnosis not present

## 2018-11-29 ENCOUNTER — Ambulatory Visit (INDEPENDENT_AMBULATORY_CARE_PROVIDER_SITE_OTHER): Payer: PPO | Admitting: Pharmacist

## 2018-11-29 ENCOUNTER — Other Ambulatory Visit: Payer: Self-pay

## 2018-11-29 DIAGNOSIS — Z9889 Other specified postprocedural states: Secondary | ICD-10-CM

## 2018-11-29 DIAGNOSIS — Z5181 Encounter for therapeutic drug level monitoring: Secondary | ICD-10-CM

## 2018-11-29 DIAGNOSIS — Z952 Presence of prosthetic heart valve: Secondary | ICD-10-CM

## 2018-11-29 DIAGNOSIS — I480 Paroxysmal atrial fibrillation: Secondary | ICD-10-CM

## 2018-11-29 LAB — POCT INR: INR: 2.6 (ref 2.0–3.0)

## 2018-11-29 NOTE — Patient Instructions (Signed)
Description   Continue on same dosage 5mg  daily except 2.5mg  on Mondays and Fridays. Recheck INR in 7 weeks. Call coumadin clinic with any medication changes [phone].

## 2018-12-09 DIAGNOSIS — R5383 Other fatigue: Secondary | ICD-10-CM | POA: Diagnosis not present

## 2018-12-09 DIAGNOSIS — E782 Mixed hyperlipidemia: Secondary | ICD-10-CM | POA: Diagnosis not present

## 2018-12-09 DIAGNOSIS — Z1211 Encounter for screening for malignant neoplasm of colon: Secondary | ICD-10-CM | POA: Diagnosis not present

## 2018-12-09 DIAGNOSIS — D649 Anemia, unspecified: Secondary | ICD-10-CM | POA: Diagnosis not present

## 2018-12-09 DIAGNOSIS — I10 Essential (primary) hypertension: Secondary | ICD-10-CM | POA: Diagnosis not present

## 2018-12-09 DIAGNOSIS — Z0000 Encounter for general adult medical examination without abnormal findings: Secondary | ICD-10-CM | POA: Diagnosis not present

## 2018-12-09 DIAGNOSIS — I480 Paroxysmal atrial fibrillation: Secondary | ICD-10-CM | POA: Diagnosis not present

## 2018-12-09 DIAGNOSIS — Z125 Encounter for screening for malignant neoplasm of prostate: Secondary | ICD-10-CM | POA: Diagnosis not present

## 2018-12-09 DIAGNOSIS — R5381 Other malaise: Secondary | ICD-10-CM | POA: Diagnosis not present

## 2019-01-18 DIAGNOSIS — H02139 Senile ectropion of unspecified eye, unspecified eyelid: Secondary | ICD-10-CM | POA: Diagnosis not present

## 2019-01-31 ENCOUNTER — Ambulatory Visit (INDEPENDENT_AMBULATORY_CARE_PROVIDER_SITE_OTHER): Payer: PPO | Admitting: Pharmacist

## 2019-01-31 ENCOUNTER — Other Ambulatory Visit: Payer: Self-pay

## 2019-01-31 DIAGNOSIS — Z9889 Other specified postprocedural states: Secondary | ICD-10-CM

## 2019-01-31 DIAGNOSIS — Z5181 Encounter for therapeutic drug level monitoring: Secondary | ICD-10-CM

## 2019-01-31 DIAGNOSIS — Z952 Presence of prosthetic heart valve: Secondary | ICD-10-CM | POA: Diagnosis not present

## 2019-01-31 DIAGNOSIS — I480 Paroxysmal atrial fibrillation: Secondary | ICD-10-CM

## 2019-01-31 LAB — POCT INR: INR: 2.7 (ref 2.0–3.0)

## 2019-01-31 NOTE — Patient Instructions (Signed)
Description   Continue on same dosage 5mg daily except 2.5mg on Mondays and Fridays. Recheck INR in 8 weeks. Call coumadin clinic with any medication changes [phone].    

## 2019-02-02 ENCOUNTER — Other Ambulatory Visit: Payer: Self-pay | Admitting: Cardiology

## 2019-02-02 DIAGNOSIS — I10 Essential (primary) hypertension: Secondary | ICD-10-CM

## 2019-03-01 NOTE — Progress Notes (Signed)
Cardiology Office Note:    Date:  03/02/2019   ID:  Erik Montgomery, DOB 1941-01-19, MRN [id number]  PCP:  Erik Broker, MD  Cardiologist:  Erik More, MD    Referring MD: Erik Broker, MD    ASSESSMENT:    1. S/P AVR   2. Essential hypertension   3. Coronary artery disease involving native coronary artery of native heart without angina pectoris   4. Long term (current) use of anticoagulants   5. Anemia, unspecified type   6. Stage 3a chronic kidney disease    PLAN:    In order of problems listed above:  1. Stable we will continue his current anticoagulant goal INR is 2.5 managed on warfarin program 2. Well-controlled continue treatment calcium channel blocker ARB thiazide diuretic 3. Stable CAD no anginal discomfort at this time I do not think he requires an ischemia evaluation 4. Continue to monitor in warfarin clinic 5. 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 Stable hyperlipidemia lipids at target   Next appointment: 6 months   Medication Adjustments/Labs and Tests Ordered: Current medicines are reviewed at length with the patient today.  Concerns regarding medicines are outlined above.  No orders of the defined types were placed in this encounter.  No orders of the defined types were placed in this encounter.   Chief Complaint  Patient presents with  . Coronary Artery Disease    follow up    History of Present Illness:    Erik Montgomery is a 79 y.o. male with a hx of CAD, Atrial Fibrillation, Dyslipidemia, HTN, S/P CABG with a no obstructive coronary stenosis and patent SVG to RCA at cath 2009 , Valvular heart disease with MV repair and a St Jude AVR in 2006 on warfarin  He was last seen 01/19/2018. Compliance with diet, lifestyle and medications: Yes  Erik Montgomery is a little bit sad he has lost one dog and is a second dog with widespread cancer.  He has mild pancytopenia last hemoglobin was 9.8 white blood  cell count 3900 platelets 122,000 and he also had stage III CKD with a GFR 48 cc on 12/09/2018.  His cholesterol that time was 112 HDL 40 LDL 60.  Shows me a handwritten note with?  Hemolysis due to his valve I think clinically he does not have any evidence of valve dysfunction I think the mechanism here is either related to his monoclonal protein spike or his CKD.  Overall he is done well he remains vigorous and active he has had no angina shortness of breath edema palpitation or syncope and his EKG today shows a maintains sinus rhythm.   Ref Range & Units 4 wk ago 3 mo ago 4 mo ago  INR 2.0 - 3.0 2.7  2.6  2.4     Past Medical History:  Diagnosis Date  . Chronic right shoulder pain 02/28/2015  . Coronary artery disease involving native coronary artery of native heart without angina pectoris 04/25/2015   Overview:  Patent  SVG to RCA at cath 2009  . Essential hypertension 09/02/2016  . H/O mitral valve repair 04/25/2015  . Heart valve replaced 10/12/2016  . Hx of CABG 04/25/2015  . Long term (current) use of anticoagulants 10/12/2016  . Mixed dyslipidemia 09/02/2016  . Mood disorder ([place]) 09/02/2016  . S/P arthroscopy of shoulder 05/20/2015    Past Surgical History:  Procedure Laterality Date  . CARDIAC VALVE REPLACEMENT    . CATARACT EXTRACTION,  BILATERAL      Current Medications: Current Meds  Medication Sig  . amLODipine (NORVASC) 5 MG tablet Take 1 tablet by mouth daily.  . Glucos-Chondroit-Hyaluron-MSM (GLUCOSAMINE CHONDROITIN JOINT PO) Take 1 tablet by mouth daily.  Marland Kitchen JANTOVEN 5 MG tablet TAKE AS DIRECTED BY COUMADIN CLINIC  . nitroGLYCERIN (NITROSTAT) 0.4 MG SL tablet Place 1 tablet (0.4 mg total) under the tongue every five (5) minutes as needed for chest pain.  . Omega-3 Fatty Acids (FISH OIL) 1000 MG CPDR Take 1 capsule by mouth daily.  . rosuvastatin (CRESTOR) 20 MG tablet Take 1 tablet by mouth daily  . telmisartan-hydrochlorothiazide (MICARDIS HCT) 80-12.5 MG tablet TAKE ONE  TABLET BY MOUTH DAILY     Allergies:   Patient has no known allergies.   Social History   Socioeconomic History  . Marital status: Single    Spouse name: Not on file  . Number of children: Not on file  . Years of education: Not on file  . Highest education level: Not on file  Occupational History  . Not on file  Tobacco Use  . Smoking status: Never Smoker  . Smokeless tobacco: Never Used  Substance and Sexual Activity  . Alcohol use: No  . Drug use: No  . Sexual activity: Not on file  Other Topics Concern  . Not on file  Social History Narrative  . Not on file   Social Determinants of Health   Financial Resource Strain:   . Difficulty of Paying Living Expenses: Not on file  Food Insecurity:   . Worried About Charity fundraiser in the Last Year: Not on file  . Ran Out of Food in the Last Year: Not on file  Transportation Needs:   . Lack of Transportation (Medical): Not on file  . Lack of Transportation (Non-Medical): Not on file  Physical Activity:   . Days of Exercise per Week: Not on file  . Minutes of Exercise per Session: Not on file  Stress:   . Feeling of Stress : Not on file  Social Connections:   . Frequency of Communication with Friends and Family: Not on file  . Frequency of Social Gatherings with Friends and Family: Not on file  . Attends Religious Services: Not on file  . Active Member of Clubs or Organizations: Not on file  . Attends Archivist Meetings: Not on file  . Marital Status: Not on file     Family History: The patient's family history includes Cancer in his father; Heart disease in his brother; Ovarian cancer in his mother. ROS:   Please see the history of present illness.    All other systems reviewed and are negative.  EKGs/Labs/Other Studies Reviewed:    The following studies were reviewed today:  EKG:  EKG ordered today and personally reviewed.  The ekg ordered today demonstrates sinus rhythm normal  Recent Labs: No  results found for requested labs within last 8760 hours.  Recent Lipid Panel    Component Value Date/Time   CHOL 137 11/04/2016 0845   TRIG 109 11/04/2016 0845   HDL 40 11/04/2016 0845   CHOLHDL 3.4 11/04/2016 0845   LDLCALC 75 11/04/2016 0845    Physical Exam:    VS:  BP (!) 118/52 (BP Location: Left Arm, Patient Position: Sitting, Cuff Size: Normal)   Pulse 86   Ht 5\' 9"  (1.753 m)   Wt 161 lb (73 kg)   SpO2 95%   BMI 23.78 kg/m  Wt Readings from Last 3 Encounters:  03/02/19 161 lb (73 kg)  01/19/18 160 lb (72.6 kg)  06/21/17 158 lb 12.8 oz (72 kg)     GEN:  Well nourished, well developed in no acute distress HEENT: Normal NECK: No JVD; No carotid bruits LYMPHATICS: No lymphadenopathy CARDIAC: Normal exam for [facility] Jude AVR no regurgitation sharp clicks RRR, no murmurs, rubs, gallops RESPIRATORY:  Clear to auscultation without rales, wheezing or rhonchi  ABDOMEN: Soft, non-tender, non-distended MUSCULOSKELETAL:  No edema; No deformity  SKIN: Warm and dry NEUROLOGIC:  Alert and oriented x 3 PSYCHIATRIC:  Normal affect    Signed, Erik More, MD  03/02/2019 9:32 AM    [place]

## 2019-03-02 ENCOUNTER — Other Ambulatory Visit: Payer: Self-pay

## 2019-03-02 ENCOUNTER — Ambulatory Visit (INDEPENDENT_AMBULATORY_CARE_PROVIDER_SITE_OTHER): Payer: Medicare Other | Admitting: Cardiology

## 2019-03-02 ENCOUNTER — Encounter: Payer: Self-pay | Admitting: Cardiology

## 2019-03-02 VITALS — BP 118/52 | HR 86 | Ht 69.0 in | Wt 161.0 lb

## 2019-03-02 DIAGNOSIS — Z9889 Other specified postprocedural states: Secondary | ICD-10-CM

## 2019-03-02 DIAGNOSIS — Z952 Presence of prosthetic heart valve: Secondary | ICD-10-CM | POA: Diagnosis not present

## 2019-03-02 DIAGNOSIS — N1831 Chronic kidney disease, stage 3a: Secondary | ICD-10-CM

## 2019-03-02 DIAGNOSIS — I2510 Atherosclerotic heart disease of native coronary artery without angina pectoris: Secondary | ICD-10-CM

## 2019-03-02 DIAGNOSIS — Z7901 Long term (current) use of anticoagulants: Secondary | ICD-10-CM | POA: Diagnosis not present

## 2019-03-02 DIAGNOSIS — D649 Anemia, unspecified: Secondary | ICD-10-CM

## 2019-03-02 DIAGNOSIS — I10 Essential (primary) hypertension: Secondary | ICD-10-CM | POA: Diagnosis not present

## 2019-03-02 MED ORDER — TELMISARTAN-HCTZ 80-12.5 MG PO TABS: 1.0000 | ORAL_TABLET | Freq: Every day | ORAL | 3 refills | Status: DC

## 2019-03-02 MED ORDER — ROSUVASTATIN CALCIUM 20 MG PO TABS: 20.0000 mg | ORAL_TABLET | Freq: Every day | ORAL | 3 refills | Status: DC

## 2019-03-02 MED ORDER — AMOXICILLIN 500 MG PO TABS: 2000.0000 mg | ORAL_TABLET | ORAL | 3 refills | Status: DC

## 2019-03-02 MED ORDER — NITROGLYCERIN 0.4 MG SL SUBL: 0.4000 mg | SUBLINGUAL_TABLET | SUBLINGUAL | 3 refills | Status: DC | PRN

## 2019-03-02 MED ORDER — AMLODIPINE BESYLATE 5 MG PO TABS: 5.0000 mg | ORAL_TABLET | Freq: Every day | ORAL | 3 refills | Status: DC

## 2019-03-02 NOTE — Patient Instructions (Signed)
Medication Instructions:  Your physician recommends that you continue on your current medications as directed. Please refer to the Current Medication list given to you today.  *If you need a refill on your cardiac medications before your next appointment, please call your pharmacy*  Lab Work: None ordered   If you have labs (blood work) drawn today and your tests are completely normal, you will receive your results only by: Marland Kitchen MyChart Message (if you have MyChart) OR . A paper copy in the mail If you have any lab test that is abnormal or we need to change your treatment, we will call you to review the results.  Testing/Procedures: None ordered   Follow-Up: At [facility], you and your health needs are our priority.  As part of our continuing mission to provide you with exceptional heart care, we have created designated Provider Care Teams.  These Care Teams include your primary Cardiologist (physician) and Advanced Practice Providers (APPs -  Physician Assistants and Nurse Practitioners) who all work together to provide you with the care you need, when you need it.  Your next appointment:   6 month(s)  The format for your next appointment:   In Person  Provider:   Shirlee More, MD  Other Instructions None

## 2019-03-28 ENCOUNTER — Other Ambulatory Visit: Payer: Self-pay

## 2019-03-28 ENCOUNTER — Ambulatory Visit (INDEPENDENT_AMBULATORY_CARE_PROVIDER_SITE_OTHER): Payer: Medicare Other | Admitting: Pharmacist

## 2019-03-28 DIAGNOSIS — Z9889 Other specified postprocedural states: Secondary | ICD-10-CM | POA: Diagnosis not present

## 2019-03-28 DIAGNOSIS — Z952 Presence of prosthetic heart valve: Secondary | ICD-10-CM | POA: Diagnosis not present

## 2019-03-28 DIAGNOSIS — Z5181 Encounter for therapeutic drug level monitoring: Secondary | ICD-10-CM | POA: Diagnosis not present

## 2019-03-28 DIAGNOSIS — I480 Paroxysmal atrial fibrillation: Secondary | ICD-10-CM

## 2019-03-28 LAB — POCT INR: INR: 2.8 (ref 2.0–3.0)

## 2019-03-28 NOTE — Patient Instructions (Signed)
Continue on same dosage 5mg daily except 2.5mg on Mondays and Fridays. Recheck INR in 8 weeks. Call coumadin clinic with any medication changes [phone].  

## 2019-05-23 ENCOUNTER — Other Ambulatory Visit: Payer: Self-pay

## 2019-05-23 ENCOUNTER — Ambulatory Visit (INDEPENDENT_AMBULATORY_CARE_PROVIDER_SITE_OTHER): Payer: Medicare Other | Admitting: Pharmacist

## 2019-05-23 DIAGNOSIS — I480 Paroxysmal atrial fibrillation: Secondary | ICD-10-CM | POA: Diagnosis not present

## 2019-05-23 DIAGNOSIS — Z9889 Other specified postprocedural states: Secondary | ICD-10-CM

## 2019-05-23 DIAGNOSIS — Z952 Presence of prosthetic heart valve: Secondary | ICD-10-CM

## 2019-05-23 DIAGNOSIS — Z5181 Encounter for therapeutic drug level monitoring: Secondary | ICD-10-CM | POA: Diagnosis not present

## 2019-05-23 LAB — POCT INR: INR: 2.6 (ref 2.0–3.0)

## 2019-05-23 NOTE — Patient Instructions (Signed)
Description   Continue on same dosage 5mg daily except 2.5mg on Mondays and Fridays. Recheck INR in 8 weeks. Call coumadin clinic with any medication changes [phone].    

## 2019-07-18 ENCOUNTER — Other Ambulatory Visit: Payer: Self-pay

## 2019-07-18 ENCOUNTER — Ambulatory Visit (INDEPENDENT_AMBULATORY_CARE_PROVIDER_SITE_OTHER): Payer: Medicare Other | Admitting: Pharmacist

## 2019-07-18 DIAGNOSIS — Z952 Presence of prosthetic heart valve: Secondary | ICD-10-CM | POA: Diagnosis not present

## 2019-07-18 DIAGNOSIS — Z5181 Encounter for therapeutic drug level monitoring: Secondary | ICD-10-CM

## 2019-07-18 DIAGNOSIS — I480 Paroxysmal atrial fibrillation: Secondary | ICD-10-CM | POA: Diagnosis not present

## 2019-07-18 DIAGNOSIS — Z9889 Other specified postprocedural states: Secondary | ICD-10-CM

## 2019-07-18 LAB — POCT INR: INR: 3 (ref 2.0–3.0)

## 2019-07-18 NOTE — Patient Instructions (Signed)
Description   Continue on same dosage 5mg  daily except 2.5mg  on Mondays and Fridays. Recheck INR in 8 weeks. Call coumadin clinic with any medication changes [phone].

## 2019-08-02 ENCOUNTER — Other Ambulatory Visit: Payer: Self-pay | Admitting: Cardiology

## 2019-09-12 ENCOUNTER — Other Ambulatory Visit: Payer: Self-pay

## 2019-09-12 ENCOUNTER — Ambulatory Visit (INDEPENDENT_AMBULATORY_CARE_PROVIDER_SITE_OTHER): Payer: Medicare Other

## 2019-09-12 DIAGNOSIS — Z5181 Encounter for therapeutic drug level monitoring: Secondary | ICD-10-CM

## 2019-09-12 DIAGNOSIS — I480 Paroxysmal atrial fibrillation: Secondary | ICD-10-CM

## 2019-09-12 DIAGNOSIS — Z952 Presence of prosthetic heart valve: Secondary | ICD-10-CM

## 2019-09-12 DIAGNOSIS — Z9889 Other specified postprocedural states: Secondary | ICD-10-CM

## 2019-09-12 LAB — POCT INR: INR: 2.9 (ref 2.0–3.0)

## 2019-09-12 NOTE — Patient Instructions (Signed)
Continue on same dosage 5mg daily except 2.5mg on Mondays and Fridays. Recheck INR in 8 weeks. Call coumadin clinic with any medication changes [phone].  

## 2019-10-04 NOTE — Progress Notes (Signed)
Cardiology Office Note:    Date:  10/05/2019   ID:  [name], DOB August 17, 1940, MRN [id number]  PCP:  Myrlene Broker, MD  Cardiologist:  Shirlee More, MD    Referring MD: Myrlene Broker, MD    ASSESSMENT:    1. S/P AVR   2. H/O mitral valve repair   3. PAF (paroxysmal atrial fibrillation) ([place])   4. Long term (current) use of anticoagulants   5. Coronary artery disease involving native coronary artery of native heart without angina pectoris   6. Essential hypertension   7. Stage 3a chronic kidney disease   8. Mixed dyslipidemia    PLAN:    In order of problems listed above:  1. Stable after valvular surgery continue anticoagulant at this time would repeat echocardiogram 2. Stable minimum symptoms I told her it was more bothersome to contact me you can use an ambulatory event monitor. 3. Continue anticoagulation goal INR 2.5-3 4. Stable CAD continue medical therapy including lipid-lowering 5. Home blood pressure consistently less than 140 continue his current treatment plan caught him for unfortunately first thing in the morning also check labs and including liver function renal function potassium return to the office and a lipid profile 6. Recheck his renal function 7. Continue a statin for hyperlipidemia check lipid profile   Next appointment: 6 months   Medication Adjustments/Labs and Tests Ordered: Current medicines are reviewed at length with the patient today.  Concerns regarding medicines are outlined above.  Orders Placed This Encounter  Procedures  . Comprehensive metabolic panel  . Hepatic function panel   No orders of the defined types were placed in this encounter.   No chief complaint on file.   History of Present Illness:    Erik Montgomery is a 79 y.o. male with a hx of CAD, Atrial Fibrillation, Dyslipidemia, HTN, S/P CABG with a no obstructive coronary stenosis and patent SVG to RCA at cath 2009 , Valvular heart disease with MV repair  and a [employer] AVR in 2006 on warfarin  last seen 03/02/1999 12 03/02/2019 compliance with diet, lifestyle and medications: Yes  Erik Montgomery remains very vigorous active golfs is much as he can and has no exercise intolerance chest pain shortness of breath palpitation or syncope.  He has drooping of the left eyelid and inflammation is considering eye plastic surgery at [facility] his anticoagulant uninterrupted without bleeding complication is followed in our warfarin clinic and his last 3 INRs were 2.93. and 2.6 therapeutic.  He has very mild brief and infrequent palpitation.  No chest pain edema shortness of breath or bleeding. Past Medical History:  Diagnosis Date  . Chronic right shoulder pain 02/28/2015  . Coronary artery disease involving native coronary artery of native heart without angina pectoris 04/25/2015   Overview:  Patent  SVG to RCA at cath 2009  . Essential hypertension 09/02/2016  . H/O mitral valve repair 04/25/2015  . Heart valve replaced 10/12/2016  . Hx of CABG 04/25/2015  . Long term (current) use of anticoagulants 10/12/2016  . Mixed dyslipidemia 09/02/2016  . Mood disorder ([place]) 09/02/2016  . PAF (paroxysmal atrial fibrillation) ([place]) 10/26/2016  . S/P arthroscopy of shoulder 05/20/2015  . S/P AVR 10/12/2016    Past Surgical History:  Procedure Laterality Date  . CARDIAC VALVE REPLACEMENT    . CATARACT EXTRACTION, BILATERAL      Current Medications: Current Meds  Medication Sig  . amLODipine (NORVASC) 5 MG tablet Take 1 tablet (5  mg total) by mouth daily.  Marland Kitchen amoxicillin (AMOXIL) 500 MG tablet Take 4 tablets (2,000 mg total) by mouth as directed. Take 4 tablets 30 mins prior to dental procedure.  . Glucos-Chondroit-Hyaluron-MSM (GLUCOSAMINE CHONDROITIN JOINT PO) Take 1 tablet by mouth daily.  Marland Kitchen JANTOVEN 5 MG tablet TAKE AS DIRECTED BY COUMADIN CLINIC  . nitroGLYCERIN (NITROSTAT) 0.4 MG SL tablet Place 1 tablet (0.4 mg total) under the tongue every 5 (five) minutes as  needed for chest pain.  . Omega-3 Fatty Acids (FISH OIL) 1000 MG CPDR Take 1 capsule by mouth daily.  . rosuvastatin (CRESTOR) 20 MG tablet Take 1 tablet (20 mg total) by mouth daily.  Marland Kitchen telmisartan-hydrochlorothiazide (MICARDIS HCT) 80-12.5 MG tablet Take 1 tablet by mouth daily.     Allergies:   Patient has no known allergies.   Social History   Socioeconomic History  . Marital status: Single    Spouse name: Not on file  . Number of children: Not on file  . Years of education: Not on file  . Highest education level: Not on file  Occupational History  . Not on file  Tobacco Use  . Smoking status: Never Smoker  . Smokeless tobacco: Never Used  Vaping Use  . Vaping Use: Never used  Substance and Sexual Activity  . Alcohol use: No  . Drug use: No  . Sexual activity: Not on file  Other Topics Concern  . Not on file  Social History Narrative  . Not on file   Social Determinants of Health   Financial Resource Strain:   . Difficulty of Paying Living Expenses: Not on file  Food Insecurity:   . Worried About Charity fundraiser in the Last Year: Not on file  . Ran Out of Food in the Last Year: Not on file  Transportation Needs:   . Lack of Transportation (Medical): Not on file  . Lack of Transportation (Non-Medical): Not on file  Physical Activity:   . Days of Exercise per Week: Not on file  . Minutes of Exercise per Session: Not on file  Stress:   . Feeling of Stress : Not on file  Social Connections:   . Frequency of Communication with Friends and Family: Not on file  . Frequency of Social Gatherings with Friends and Family: Not on file  . Attends Religious Services: Not on file  . Active Member of Clubs or Organizations: Not on file  . Attends Archivist Meetings: Not on file  . Marital Status: Not on file     Family History: The patient's family history includes Cancer in his father; Heart disease in his brother; Ovarian cancer in his mother. ROS:     Please see the history of present illness.    All other systems reviewed and are negative.  EKGs/Labs/Other Studies Reviewed:    The following studies were reviewed today:   Recent Labs: No results found for requested labs within last 8760 hours.  Recent Lipid Panel    Component Value Date/Time   CHOL 137 11/04/2016 0845   TRIG 109 11/04/2016 0845   HDL 40 11/04/2016 0845   CHOLHDL 3.4 11/04/2016 0845   LDLCALC 75 11/04/2016 0845   Last lipid profile 11/07/05/2018 [place] cholesterol 112 LDL 60 triglycerides 95 HDL 40.  CMP at that time showed mild renal dysfunction creatinine 1.40 GFR 48 cc potassium 4.6 and normal liver function test Physical Exam:    VS:  BP (!) 160/56   Pulse  66   Ht 5\' 9"  (1.753 m)   Wt 161 lb 12.8 oz (73.4 kg)   SpO2 96%   BMI 23.89 kg/m     Wt Readings from Last 3 Encounters:  10/05/19 161 lb 12.8 oz (73.4 kg)  03/02/19 161 lb (73 kg)  01/19/18 160 lb (72.6 kg)     GEN:  Well nourished, well developed in no acute distress HEENT: Normal NECK: No JVD; No carotid bruits LYMPHATICS: No lymphadenopathy CARDIAC: RRR, no murmurs, rubs, gallops Sharp closing sound his aortic valve prosthesis no regurgitation no murmur RESPIRATORY:  Clear to auscultation without rales, wheezing or rhonchi  ABDOMEN: Soft, non-tender, non-distended MUSCULOSKELETAL:  No edema; No deformity  SKIN: Warm and dry NEUROLOGIC:  Alert and oriented x 3 PSYCHIATRIC:  Normal affect    Signed, Shirlee More, MD  10/05/2019 8:59 AM    [place]

## 2019-10-05 ENCOUNTER — Ambulatory Visit: Payer: Medicare Other | Admitting: Cardiology

## 2019-10-05 ENCOUNTER — Other Ambulatory Visit: Payer: Self-pay

## 2019-10-05 ENCOUNTER — Encounter: Payer: Self-pay | Admitting: Cardiology

## 2019-10-05 VITALS — BP 160/56 | HR 66 | Ht 69.0 in | Wt 161.8 lb

## 2019-10-05 DIAGNOSIS — I480 Paroxysmal atrial fibrillation: Secondary | ICD-10-CM | POA: Diagnosis not present

## 2019-10-05 DIAGNOSIS — N1831 Chronic kidney disease, stage 3a: Secondary | ICD-10-CM

## 2019-10-05 DIAGNOSIS — I2510 Atherosclerotic heart disease of native coronary artery without angina pectoris: Secondary | ICD-10-CM

## 2019-10-05 DIAGNOSIS — Z7901 Long term (current) use of anticoagulants: Secondary | ICD-10-CM

## 2019-10-05 DIAGNOSIS — I10 Essential (primary) hypertension: Secondary | ICD-10-CM

## 2019-10-05 DIAGNOSIS — E782 Mixed hyperlipidemia: Secondary | ICD-10-CM

## 2019-10-05 DIAGNOSIS — Z952 Presence of prosthetic heart valve: Secondary | ICD-10-CM

## 2019-10-05 DIAGNOSIS — Z9889 Other specified postprocedural states: Secondary | ICD-10-CM | POA: Diagnosis not present

## 2019-10-05 NOTE — Patient Instructions (Signed)

## 2019-10-06 ENCOUNTER — Telehealth: Payer: Self-pay

## 2019-10-06 DIAGNOSIS — I10 Essential (primary) hypertension: Secondary | ICD-10-CM

## 2019-10-06 LAB — COMPREHENSIVE METABOLIC PANEL
ALT: 24 IU/L (ref 0–44)
AST: 23 IU/L (ref 0–40)
Albumin/Globulin Ratio: 1.8 (ref 1.2–2.2)
Albumin: 4.4 g/dL (ref 3.7–4.7)
Alkaline Phosphatase: 61 IU/L (ref 48–121)
BUN/Creatinine Ratio: 14 (ref 10–24)
BUN: 23 mg/dL (ref 8–27)
Bilirubin Total: 0.4 mg/dL (ref 0.0–1.2)
CO2: 26 mmol/L (ref 20–29)
Calcium: 9.2 mg/dL (ref 8.6–10.2)
Chloride: 104 mmol/L (ref 96–106)
Creatinine, Ser: 1.63 mg/dL — ABNORMAL HIGH (ref 0.76–1.27)
GFR calc Af Amer: 46 mL/min/{1.73_m2} — ABNORMAL LOW (ref >59)
GFR calc non Af Amer: 40 mL/min/{1.73_m2} — ABNORMAL LOW (ref >59)
Globulin, Total: 2.5 g/dL (ref 1.5–4.5)
Glucose: 91 mg/dL (ref 65–99)
Potassium: 4.5 mmol/L (ref 3.5–5.2)
Sodium: 142 mmol/L (ref 134–144)
Total Protein: 6.9 g/dL (ref 6.0–8.5)

## 2019-10-06 LAB — HEPATIC FUNCTION PANEL: Bilirubin, Direct: 0.14 mg/dL (ref 0.00–0.40)

## 2019-10-06 MED ORDER — TELMISARTAN 80 MG PO TABS: 80.0000 mg | ORAL_TABLET | Freq: Every day | ORAL | 3 refills | Status: DC

## 2019-10-06 NOTE — Telephone Encounter (Signed)
Spoke with patient regarding results and recommendation.  Patient verbalizes understanding and is agreeable to plan of care. Advised patient to call back with any issues or concerns.  

## 2019-10-06 NOTE — Telephone Encounter (Signed)
-----   Message from Richardo Priest, MD sent at 10/06/2019  4:42 PM EDT ----- He has had a significant change in his kidney function  Stop the hydrochlorothiazide and keep him on telmisartan 80 mg daily  Check BMP 2 weeks

## 2019-10-20 ENCOUNTER — Other Ambulatory Visit: Payer: Self-pay

## 2019-10-20 DIAGNOSIS — I10 Essential (primary) hypertension: Secondary | ICD-10-CM

## 2019-10-20 LAB — BASIC METABOLIC PANEL
BUN/Creatinine Ratio: 14 (ref 10–24)
BUN: 21 mg/dL (ref 8–27)
CO2: 24 mmol/L (ref 20–29)
Calcium: 8.8 mg/dL (ref 8.6–10.2)
Chloride: 107 mmol/L — ABNORMAL HIGH (ref 96–106)
Creatinine, Ser: 1.47 mg/dL — ABNORMAL HIGH (ref 0.76–1.27)
GFR calc Af Amer: 52 mL/min/{1.73_m2} — ABNORMAL LOW (ref >59)
GFR calc non Af Amer: 45 mL/min/{1.73_m2} — ABNORMAL LOW (ref >59)
Glucose: 114 mg/dL — ABNORMAL HIGH (ref 65–99)
Potassium: 4.4 mmol/L (ref 3.5–5.2)
Sodium: 142 mmol/L (ref 134–144)

## 2019-11-07 ENCOUNTER — Ambulatory Visit (INDEPENDENT_AMBULATORY_CARE_PROVIDER_SITE_OTHER): Payer: Medicare Other

## 2019-11-07 ENCOUNTER — Other Ambulatory Visit: Payer: Self-pay

## 2019-11-07 DIAGNOSIS — I480 Paroxysmal atrial fibrillation: Secondary | ICD-10-CM

## 2019-11-07 DIAGNOSIS — Z5181 Encounter for therapeutic drug level monitoring: Secondary | ICD-10-CM

## 2019-11-07 DIAGNOSIS — Z9889 Other specified postprocedural states: Secondary | ICD-10-CM

## 2019-11-07 DIAGNOSIS — Z952 Presence of prosthetic heart valve: Secondary | ICD-10-CM

## 2019-11-07 LAB — POCT INR: INR: 2.5 (ref 2.0–3.0)

## 2019-11-07 NOTE — Patient Instructions (Signed)
Continue on same dosage 5mg daily except 2.5mg on Mondays and Fridays. Recheck INR in 8 weeks. Call coumadin clinic with any medication changes [phone].  

## 2019-12-11 DIAGNOSIS — N1831 Chronic kidney disease, stage 3a: Secondary | ICD-10-CM | POA: Insufficient documentation

## 2019-12-11 DIAGNOSIS — R7309 Other abnormal glucose: Secondary | ICD-10-CM | POA: Insufficient documentation

## 2019-12-11 DIAGNOSIS — R5381 Other malaise: Secondary | ICD-10-CM | POA: Insufficient documentation

## 2020-01-02 ENCOUNTER — Ambulatory Visit: Payer: Medicare Other

## 2020-01-02 ENCOUNTER — Other Ambulatory Visit: Payer: Self-pay

## 2020-01-02 DIAGNOSIS — I480 Paroxysmal atrial fibrillation: Secondary | ICD-10-CM

## 2020-01-02 DIAGNOSIS — Z952 Presence of prosthetic heart valve: Secondary | ICD-10-CM

## 2020-01-02 DIAGNOSIS — Z9889 Other specified postprocedural states: Secondary | ICD-10-CM

## 2020-01-02 DIAGNOSIS — Z5181 Encounter for therapeutic drug level monitoring: Secondary | ICD-10-CM

## 2020-01-02 LAB — POCT INR: INR: 2.5 (ref 2.0–3.0)

## 2020-01-02 NOTE — Patient Instructions (Signed)
Continue on same dosage 5mg  daily except 2.5mg  on Mondays and Fridays. Recheck INR in 8 weeks. Call coumadin clinic with any medication changes [phone].

## 2020-02-27 ENCOUNTER — Other Ambulatory Visit: Payer: Self-pay

## 2020-02-27 ENCOUNTER — Ambulatory Visit: Payer: Medicare Other

## 2020-02-27 DIAGNOSIS — Z9889 Other specified postprocedural states: Secondary | ICD-10-CM

## 2020-02-27 DIAGNOSIS — Z5181 Encounter for therapeutic drug level monitoring: Secondary | ICD-10-CM

## 2020-02-27 DIAGNOSIS — I480 Paroxysmal atrial fibrillation: Secondary | ICD-10-CM | POA: Diagnosis not present

## 2020-02-27 DIAGNOSIS — Z952 Presence of prosthetic heart valve: Secondary | ICD-10-CM

## 2020-02-27 LAB — POCT INR: INR: 2.7 (ref 2.0–3.0)

## 2020-02-27 NOTE — Patient Instructions (Signed)
Continue on same dosage 5mg daily except 2.5mg on Mondays and Fridays. Recheck INR in 8 weeks. Call coumadin clinic with any medication changes [phone].  

## 2020-03-28 ENCOUNTER — Other Ambulatory Visit: Payer: Self-pay | Admitting: Cardiology

## 2020-03-28 DIAGNOSIS — I2510 Atherosclerotic heart disease of native coronary artery without angina pectoris: Secondary | ICD-10-CM

## 2020-03-28 NOTE — Telephone Encounter (Signed)
Rx refill sent to pharmacy. 

## 2020-04-16 ENCOUNTER — Ambulatory Visit: Payer: Medicare Other

## 2020-04-16 ENCOUNTER — Other Ambulatory Visit: Payer: Self-pay

## 2020-04-16 DIAGNOSIS — Z9889 Other specified postprocedural states: Secondary | ICD-10-CM | POA: Diagnosis not present

## 2020-04-16 DIAGNOSIS — Z952 Presence of prosthetic heart valve: Secondary | ICD-10-CM

## 2020-04-16 DIAGNOSIS — I480 Paroxysmal atrial fibrillation: Secondary | ICD-10-CM

## 2020-04-16 DIAGNOSIS — Z5181 Encounter for therapeutic drug level monitoring: Secondary | ICD-10-CM | POA: Diagnosis not present

## 2020-04-16 LAB — POCT INR: INR: 3.4 — AB (ref 2.0–3.0)

## 2020-04-16 NOTE — Patient Instructions (Signed)
Hold today only and then Continue on same dosage 5mg  daily except 2.5mg  on Mondays and Fridays. Recheck INR in 8 weeks. Call coumadin clinic with any medication changes [phone].

## 2020-04-19 ENCOUNTER — Telehealth: Payer: Self-pay | Admitting: Cardiology

## 2020-04-19 NOTE — Telephone Encounter (Signed)
Spoke with patient and the decision was made to reschedule his procedure so that the bridge could be done properly and not rushed. Pt will let us know as soon as its rescheduled so we can schedule him in the [place] coumadin clinic for bridge coordination.

## 2020-04-19 NOTE — Telephone Encounter (Addendum)
Patient with diagnosis of history of mitral valve repair, AVR, and paroxysmal afib on warfarin for anticoagulation.    Procedure: Ectropion repair Date of procedure: 04/23/20  CHA2DS2-VASc Score = 4  This indicates a 4.8% annual risk of stroke. The patient's score is based upon: CHF History: No HTN History: Yes Diabetes History: No Stroke History: No Vascular Disease History: Yes Age Score: 2 Gender Score: 0  CrCl 47.5 mL/min Platelet count 128K  Per office protocol, patient can hold warfarin for 5 days prior to procedure. Patient will need bridging with Lovenox (enoxaparin) around procedure. This needs to be coordinated with [place] [place] coumadin clinic. Patient will need to reschedule procedure due to need for 5 day hold and bridging.

## 2020-04-19 NOTE — Telephone Encounter (Signed)
   Primary Cardiologist: Shirlee More, MD  Chart reviewed as part of pre-operative protocol coverage.  Per pharmacy recommendations, patient can hold coumadin 5 days prior to his upcoming ectropion repair, though WILL require lovenox bridging. As a result, the procedure will need to be delayed to allow coordination of the bridge with the coumadin clinic in [place].   Pre-op covering staff: - Please contact requesting surgeon's office via preferred method (i.e, phone, fax) to inform them of need to delay his procedure.  Abigail Butts, PA-C 04/19/2020, 5:18 PM

## 2020-04-19 NOTE — Telephone Encounter (Signed)
   [facility] Pre-operative Risk Assessment    HEARTCARE STAFF: - Please ensure there is not already an duplicate clearance open for this procedure. - Under Visit Info/Reason for Call, type in Other and utilize the format Clearance MM/DD/YY or Clearance TBD. Do not use dashes or single digits. - If request is for dental extraction, please clarify the # of teeth to be extracted.  Request for surgical clearance:  1. What type of surgery is being performed? ectropion repair   2. When is this surgery scheduled? 04/23/2020  3. What type of clearance is required (medical clearance vs. Pharmacy clearance to hold med vs. Both)? Pharmacy  4. Are there any medications that need to be held prior to surgery and how long? Hold warfarin hold starting today or tomorrow, resume on 04/24/20  5. Practice name and name of physician performing surgery? [facility], Dr. Kathy Breach   6. What is the office phone number? [phone]   7.   What is the office fax number? [phone]  8.   Anesthesia type (None, local, MAC, general) ? IV sedation local   Selena Zobro 04/19/2020, 3:11 PM  _________________________________________________________________   (provider comments below)

## 2020-04-22 NOTE — Telephone Encounter (Signed)
Tim from [employer] back.

## 2020-04-22 NOTE — Telephone Encounter (Signed)
S/w with Tim today [facility]. Informed pt's procedure for tomorrow will need to be postponed as he is going to need to hold Warfarin and will need Lovenox bridging. Tim thanked me for the update. Tim stated per Dr. Dema Severin that it has been discussed to possibly proceed with the pt on Warfarin, though the risk of increased bleeding and bruising. May prefer to proceed with Lovenox bridging. Tim states he will also call the pt today and discuss which option would her prefer.   Tim, stated he will call pre op team back and update after he s/w the pt. I will fax notes over as FYI to Dr. Orest Dikes office.

## 2020-04-22 NOTE — Telephone Encounter (Signed)
INR appointment rescheduled for tomorrow

## 2020-04-22 NOTE — Telephone Encounter (Signed)
Tim from [employer] called back with update. Pt has been rescheduled with Dr. Dema Severin 05/21/20. Will continue to proceed with Lovenox bridging. I will forward clearance request back to pre op pool. I thanked Tim for the update.

## 2020-04-22 NOTE — Patient Instructions (Addendum)
Description   Continue on same dosage 5mg  daily except 2.5mg  on Mondays and Fridays. Recheck INR on 4/29. Follow Lovenox instructions on your handout.  Please call coumadin clinic with any questions or medication changes [phone].  05/15/20: Last dose of warfarin.  05/16/20: No warfarin or enoxaparin (Lovenox).  05/17/20: Inject enoxaparin 80mg  in the fatty abdominal tissue at least 2 inches from the belly button twice a day about 12 hours apart, 8am and 8pm rotate sites. No warfarin.  05/18/20: Inject enoxaparin in the fatty tissue every 12 hours, 8am and 8pm. No warfarin.  05/19/20: Inject enoxaparin in the fatty tissue every 12 hours, 8am and 8pm. No warfarin.  05/20/20: Inject enoxaparin in the fatty tissue in the morning at 8 am (No PM dose). No warfarin.  05/21/20: Procedure Day - No enoxaparin - Resume warfarin in the evening or as directed by doctor (take an extra half tablet with usual dose for 2 days then resume normal dose).  05/22/20: Resume enoxaparin inject in the fatty tissue every 12 hours and take warfarin  05/23/20: Inject enoxaparin in the fatty tissue every 12 hours and take warfarin  05/24/20: Inject enoxaparin in the fatty tissue every 12 hours and take warfarin  05/25/20: Inject enoxaparin in the fatty tissue every 12 hours and take warfarin  05/26/20: Inject enoxaparin in the fatty tissue every 12 hours and take warfarin  05/27/20: Take only warfarin  05/28/20: Return for INR appointment

## 2020-04-22 NOTE — Telephone Encounter (Signed)
S/w the pt and informed he will need a pre op appt with cardiologist. Pt wants to see Dr. Bettina Gavia. Pt has been scheduled to see Dr. Bettina Gavia 04/26/20 8 am. Pt thanked me for the call and the help. I will forward notes to MD for upcoming appt. Pt's [facility] appt will need to be changed as well. I will address this to CVRR for Dr. Bettina Gavia as well.

## 2020-04-22 NOTE — Telephone Encounter (Signed)
   Primary Cardiologist: Shirlee More, MD  Chart reviewed as part of pre-operative protocol coverage. Because of Dquan Cortopassi Schicker's past medical history and time since last visit, he will require a follow-up visit in order to better assess preoperative cardiovascular risk.  Last seen by Dr. Bettina Gavia 10/2019 with recommendation for 6 month follow-up for 04/2020. Additionally per chart review he was recently seen by primary care for evaluation of palpitations so recommend clinical cardiology follow-up prior to finalizing clearance.  Pre-op covering staff: - Please schedule appointment and call patient to inform them.  - Please contact requesting surgeon's office via preferred method (i.e, phone, fax) to inform them of need for appointment prior to surgery.  As below, once cleared, will need anticoagulation plan as outlined below with Lovenox bridging.  Charlie Pitter, PA-C  04/22/2020, 10:18 AM

## 2020-04-23 ENCOUNTER — Ambulatory Visit: Payer: Medicare Other | Admitting: Pharmacist

## 2020-04-23 ENCOUNTER — Other Ambulatory Visit: Payer: Self-pay

## 2020-04-23 DIAGNOSIS — Z952 Presence of prosthetic heart valve: Secondary | ICD-10-CM

## 2020-04-23 DIAGNOSIS — Z5181 Encounter for therapeutic drug level monitoring: Secondary | ICD-10-CM | POA: Diagnosis not present

## 2020-04-23 DIAGNOSIS — Z9889 Other specified postprocedural states: Secondary | ICD-10-CM

## 2020-04-23 DIAGNOSIS — I480 Paroxysmal atrial fibrillation: Secondary | ICD-10-CM

## 2020-04-23 LAB — POCT INR: INR: 3 (ref 2.0–3.0)

## 2020-04-23 MED ORDER — ENOXAPARIN SODIUM 80 MG/0.8ML ~~LOC~~ SOLN: 80.0000 mg | Freq: Two times a day (BID) | SUBCUTANEOUS | 0 refills | Status: DC

## 2020-04-23 NOTE — Progress Notes (Signed)
05/15/20: Last dose of warfarin.  05/16/20: No warfarin or enoxaparin (Lovenox).  05/17/20: Inject enoxaparin 80mg  in the fatty abdominal tissue at least 2 inches from the belly button twice a day about 12 hours apart, 8am and 8pm rotate sites. No warfarin.  05/18/20: Inject enoxaparin in the fatty tissue every 12 hours, 8am and 8pm. No warfarin.  05/19/20: Inject enoxaparin in the fatty tissue every 12 hours, 8am and 8pm. No warfarin.  05/20/20: Inject enoxaparin in the fatty tissue in the morning at 8 am (No PM dose). No warfarin.  05/21/20: Procedure Day - No enoxaparin - Resume warfarin in the evening or as directed by doctor (take an extra half tablet with usual dose for 2 days then resume normal dose).  05/22/20: Resume enoxaparin inject in the fatty tissue every 12 hours and take warfarin  05/23/20: Inject enoxaparin in the fatty tissue every 12 hours and take warfarin  05/24/20: Inject enoxaparin in the fatty tissue every 12 hours and take warfarin  05/25/20: Inject enoxaparin in the fatty tissue every 12 hours and take warfarin  05/26/20: Inject enoxaparin in the fatty tissue every 12 hours and take warfarin  05/27/20: Take only warfarin  05/28/20: Return for INR appointment

## 2020-04-25 NOTE — Progress Notes (Signed)
Cardiology Office Note:    Date:  04/26/2020   ID:  Erik Montgomery, DOB 1940/09/12, MRN [id number]  PCP:  Myrlene Broker, MD  Cardiologist:  Shirlee More, MD    Referring MD: Myrlene Broker, MD    ASSESSMENT:    1. Preoperative cardiovascular examination   2. S/P AVR   3. Long term (current) use of anticoagulants   4. Essential hypertension   5. Coronary artery disease involving native coronary artery of native heart without angina pectoris   6. Stage 3a chronic kidney disease ([place])   7. Mixed dyslipidemia    PLAN:    In order of problems listed above:  1. A decision has already been made through the Coumadin clinic and his ophthalmologist for withdrawal of anticoagulants and bridging.  Fortunately is a low risk group for thrombotic complications with a mechanical AVR in the aortic position in sinus rhythm. 2. BP elevated on current treatment with amlodipine and telmisartan 150/60 at this time and a single measurement on the chronic changes antihypertensive therapy.  Continue calcium channel blocker ARB 3. Stable CAD no anginal discomfort continue medical therapy including antihypertensives anticoagulant and his high intensity statin 4. Stable improved CKD 5. Stable lipids are at target continue high intensity statin with CAD   Next appointment: 1 year   Medication Adjustments/Labs and Tests Ordered: Current medicines are reviewed at length with the patient today.  Concerns regarding medicines are outlined above.  Orders Placed This Encounter  Procedures  . EKG 12-Lead   No orders of the defined types were placed in this encounter.   Chief Complaint  Patient presents with  . Pre-op Exam    Left Eye Surgery    History of Present Illness:    Erik Montgomery is a 80 y.o. male with a hx of CAD, Atrial Fibrillation, Dyslipidemia, HTN, S/P CABG with no obstructive coronary stenosis and patent SVG to RCA at cath 2009 , Valvular heart disease with MV repair and a  St Jude AVR in 2006 on warfarin  last seen 10/05/2019.  Compliance with diet, lifestyle and medications: Yes  His surgeon communicated with the warfarin clinic a decision was made for bridging anticoagulation and he has arranged and scheduled for surgery 05/21/2020.  Fortunately has an aortic mechanical valve is in sinus rhythm and is a low risk group for thrombotic complications. He has minimal palpitation he recently wore a monitor through his PCP and were trying to access the report. No exercise intolerance shortness of breath chest pain or syncope.  Most recent labs [facility] 12/11/2019 Potassium 4.6 sodium 143 creatinine 1.31 GFR 52 cc stage II CKD direct LDL is 63 cholesterol 120 HDL 38 triglycerides 114 his lipids are at target CBC performed the same day shows anemia hemoglobin 10.9 platelets mildly diminished 128,000 is a chronic problem  [facility] Pre-operative Risk Assessment    HEARTCARE STAFF: - Please ensure there is not already an duplicate clearance open for this procedure. - Under Visit Info/Reason for Call, type in Other and utilize the format Clearance MM/DD/YY or Clearance TBD. Do not use dashes or single digits. - If request is for dental extraction, please clarify the # of teeth to be extracted.  Request for surgical clearance:  1. What type of surgery is being performed? ectropion repair   2. When is this surgery scheduled? 04/23/2020  3. What type of clearance is required (medical clearance vs. Pharmacy clearance to hold med vs. Both)?  Pharmacy  4. Are there any medications that need to be held prior to surgery and how long? Hold warfarin hold starting today or tomorrow, resume on 04/24/20  5. Practice name and name of physician performing surgery? [facility], Dr. Kathy Breach   6. What is the office phone number? [phone]   7.   What is the office fax number? [phone]  8.   Anesthesia type (None,  local, MAC, general) ? IV sedation local  Past Medical History:  Diagnosis Date  . Chronic right shoulder pain 02/28/2015  . Coronary artery disease involving native coronary artery of native heart without angina pectoris 04/25/2015   Overview:  Patent  SVG to RCA at cath 2009  . Essential hypertension 09/02/2016  . H/O mitral valve repair 04/25/2015  . Heart valve replaced 10/12/2016  . Hx of CABG 04/25/2015  . Long term (current) use of anticoagulants 10/12/2016  . Mixed dyslipidemia 09/02/2016  . Mood disorder ([place]) 09/02/2016  . PAF (paroxysmal atrial fibrillation) ([place]) 10/26/2016  . S/P arthroscopy of shoulder 05/20/2015  . S/P AVR 10/12/2016    Past Surgical History:  Procedure Laterality Date  . CARDIAC VALVE REPLACEMENT    . CATARACT EXTRACTION, BILATERAL      Current Medications: Current Meds  Medication Sig  . amLODipine (NORVASC) 5 MG tablet Take 1 tablet (5 mg total) by mouth daily.  Marland Kitchen amoxicillin (AMOXIL) 500 MG tablet Take 4 tablets (2,000 mg total) by mouth as directed. Take 4 tablets 30 mins prior to dental procedure.  . enoxaparin (LOVENOX) 80 MG/0.8ML injection Inject 0.8 mLs (80 mg total) into the skin every 12 (twelve) hours.  . Glucos-Chondroit-Hyaluron-MSM (GLUCOSAMINE CHONDROITIN JOINT PO) Take 1 tablet by mouth daily.  Marland Kitchen JANTOVEN 5 MG tablet TAKE AS DIRECTED BY COUMADIN CLINIC  . nitroGLYCERIN (NITROSTAT) 0.4 MG SL tablet Place 1 tablet (0.4 mg total) under the tongue every 5 (five) minutes as needed for chest pain.  . Omega-3 Fatty Acids (FISH OIL) 1000 MG CPDR Take 1 capsule by mouth daily.  . rosuvastatin (CRESTOR) 20 MG tablet Take 1 tablet (20 mg total) by mouth daily.  Marland Kitchen telmisartan (MICARDIS) 80 MG tablet Take 1 tablet (80 mg total) by mouth daily.     Allergies:   Patient has no known allergies.   Social History   Socioeconomic History  . Marital status: Single    Spouse name: Not on file  . Number of children: Not on file  . Years of education: Not on  file  . Highest education level: Not on file  Occupational History  . Not on file  Tobacco Use  . Smoking status: Never Smoker  . Smokeless tobacco: Never Used  Vaping Use  . Vaping Use: Never used  Substance and Sexual Activity  . Alcohol use: No  . Drug use: No  . Sexual activity: Not on file  Other Topics Concern  . Not on file  Social History Narrative  . Not on file   Social Determinants of Health   Financial Resource Strain: Not on file  Food Insecurity: Not on file  Transportation Needs: Not on file  Physical Activity: Not on file  Stress: Not on file  Social Connections: Not on file     Family History: The patient's family history includes Cancer in his father; Heart disease in his brother; Ovarian cancer in his mother. ROS:   Please see the history of present illness.    All other systems reviewed and are negative.  EKGs/Labs/Other Studies Reviewed:    The following studies were reviewed today:  EKG:  EKG ordered today and personally reviewed.  The ekg ordered today demonstrates sinus rhythm with occasional APC otherwise normal  Recent Labs: 10/06/2019: ALT 24 10/20/2019: BUN 21; Creatinine, Ser 1.47; Potassium 4.4; Sodium 142  Recent Lipid Panel    Component Value Date/Time   CHOL 137 11/04/2016 0845   TRIG 109 11/04/2016 0845   HDL 40 11/04/2016 0845   CHOLHDL 3.4 11/04/2016 0845   LDLCALC 75 11/04/2016 0845    Physical Exam:    VS:  BP (!) 170/60 (BP Location: Right Arm, Patient Position: Sitting, Cuff Size: Normal)   Pulse 67   Ht _0  (1.753 m)   Wt 166 lb (75.3 kg)   SpO2 99%   BMI 24.51 kg/m     Wt Readings from Last 3 Encounters:  04/26/20 166 lb (75.3 kg)  10/05/19 161 lb 12.8 oz (73.4 kg)  03/02/19 161 lb (73 kg)     GEN:  Well nourished, well developed in no acute distress HEENT: Normal NECK: No JVD; No carotid bruits LYMPHATICS: No lymphadenopathy CARDIAC: Sharp closing sound Saint Jude prosthesis AVR no aortic regurgitation  RRR, no murmurs, rubs, gallops RESPIRATORY:  Clear to auscultation without rales, wheezing or rhonchi  ABDOMEN: Soft, non-tender, non-distended MUSCULOSKELETAL:  No edema; No deformity  SKIN: Warm and dry NEUROLOGIC:  Alert and oriented x 3 PSYCHIATRIC:  Normal affect    Signed, Shirlee More, MD  04/26/2020 8:21 AM    [facility]

## 2020-04-26 ENCOUNTER — Ambulatory Visit: Payer: Medicare Other | Admitting: Cardiology

## 2020-04-26 ENCOUNTER — Other Ambulatory Visit: Payer: Self-pay | Admitting: Cardiology

## 2020-04-26 ENCOUNTER — Encounter: Payer: Self-pay | Admitting: Cardiology

## 2020-04-26 ENCOUNTER — Other Ambulatory Visit: Payer: Self-pay

## 2020-04-26 VITALS — BP 150/60 | HR 67 | Ht 69.0 in | Wt 166.0 lb

## 2020-04-26 DIAGNOSIS — Z01810 Encounter for preprocedural cardiovascular examination: Secondary | ICD-10-CM

## 2020-04-26 DIAGNOSIS — I2510 Atherosclerotic heart disease of native coronary artery without angina pectoris: Secondary | ICD-10-CM

## 2020-04-26 DIAGNOSIS — Z7901 Long term (current) use of anticoagulants: Secondary | ICD-10-CM | POA: Diagnosis not present

## 2020-04-26 DIAGNOSIS — I10 Essential (primary) hypertension: Secondary | ICD-10-CM | POA: Diagnosis not present

## 2020-04-26 DIAGNOSIS — N1831 Chronic kidney disease, stage 3a: Secondary | ICD-10-CM

## 2020-04-26 DIAGNOSIS — Z952 Presence of prosthetic heart valve: Secondary | ICD-10-CM

## 2020-04-26 DIAGNOSIS — E782 Mixed hyperlipidemia: Secondary | ICD-10-CM

## 2020-04-26 MED ORDER — METOPROLOL SUCCINATE ER 25 MG PO TB24: 12.5000 mg | ORAL_TABLET | Freq: Every day | ORAL | 3 refills | Status: DC

## 2020-04-26 NOTE — Patient Instructions (Signed)
Medication Instructions:  Your physician has recommended you make the following change in your medication:  START: TOPROL XL 12.5 mg take 0.5 tablet by mouth daily.  *If you need a refill on your cardiac medications before your next appointment, please call your pharmacy*   Lab Work: None If you have labs (blood work) drawn today and your tests are completely normal, you will receive your results only by: Marland Kitchen MyChart Message (if you have MyChart) OR . A paper copy in the mail If you have any lab test that is abnormal or we need to change your treatment, we will call you to review the results.   Testing/Procedures: NOne   Follow-Up: At [facility], you and your health needs are our priority.  As part of our continuing mission to provide you with exceptional heart care, we have created designated Provider Care Teams.  These Care Teams include your primary Cardiologist (physician) and Advanced Practice Providers (APPs -  Physician Assistants and Nurse Practitioners) who all work together to provide you with the care you need, when you need it.  We recommend signing up for the patient portal called "MyChart".  Sign up information is provided on this After Visit Summary.  MyChart is used to connect with patients for Virtual Visits (Telemedicine).  Patients are able to view lab/test results, encounter notes, upcoming appointments, etc.  Non-urgent messages can be sent to your provider as well.   To learn more about what you can do with MyChart, go to [email or web address].    Your next appointment:   1 year(s)  The format for your next appointment:   In Person  Provider:   Shirlee More, MD   Other Instructions

## 2020-05-06 ENCOUNTER — Telehealth: Payer: Self-pay | Admitting: Pharmacist

## 2020-05-06 NOTE — Telephone Encounter (Signed)
Called and spoke with patient regarding Lovenox.  Reported he is waiting to hear back from his eye surgeon and/or Dr Bettina Gavia to see if he truly needs Lovenox bridging.  Patient still has the bridging instructions given to him on 04/23/20.

## 2020-05-06 NOTE — Telephone Encounter (Signed)
Spoke to the patient just now and let him know that Dr. Bettina Gavia spoke with Dr. Dema Severin and they have decided that he will need to bridge over to lovenox for his procedure. Dr. Bettina Gavia has recommended that the patient follow the instructions for the lovenox as directed by the pharmacist.

## 2020-05-28 ENCOUNTER — Other Ambulatory Visit: Payer: Self-pay

## 2020-05-28 ENCOUNTER — Ambulatory Visit (INDEPENDENT_AMBULATORY_CARE_PROVIDER_SITE_OTHER): Payer: Medicare Other

## 2020-05-28 DIAGNOSIS — Z9889 Other specified postprocedural states: Secondary | ICD-10-CM

## 2020-05-28 DIAGNOSIS — Z952 Presence of prosthetic heart valve: Secondary | ICD-10-CM

## 2020-05-28 DIAGNOSIS — I480 Paroxysmal atrial fibrillation: Secondary | ICD-10-CM

## 2020-05-28 DIAGNOSIS — Z5181 Encounter for therapeutic drug level monitoring: Secondary | ICD-10-CM | POA: Diagnosis not present

## 2020-05-28 LAB — POCT INR: INR: 2.1 (ref 2.0–3.0)

## 2020-05-28 NOTE — Patient Instructions (Signed)
Continue on same dosage 5mg  daily except 2.5mg  on Mondays and Fridays.  Please call coumadin clinic with any questions or medication changes [phone].

## 2020-07-09 ENCOUNTER — Other Ambulatory Visit: Payer: Self-pay

## 2020-07-09 ENCOUNTER — Ambulatory Visit (INDEPENDENT_AMBULATORY_CARE_PROVIDER_SITE_OTHER): Payer: Medicare Other

## 2020-07-09 DIAGNOSIS — Z9889 Other specified postprocedural states: Secondary | ICD-10-CM | POA: Diagnosis not present

## 2020-07-09 DIAGNOSIS — Z5181 Encounter for therapeutic drug level monitoring: Secondary | ICD-10-CM

## 2020-07-09 DIAGNOSIS — Z952 Presence of prosthetic heart valve: Secondary | ICD-10-CM

## 2020-07-09 DIAGNOSIS — I480 Paroxysmal atrial fibrillation: Secondary | ICD-10-CM | POA: Diagnosis not present

## 2020-07-09 LAB — POCT INR: INR: 2.7 (ref 2.0–3.0)

## 2020-07-09 NOTE — Patient Instructions (Signed)
Continue on same dosage 5mg  daily except 2.5mg  on Mondays and Fridays. Repeat 6 weeks. Please call coumadin clinic with any questions or medication changes [phone].

## 2020-08-20 ENCOUNTER — Other Ambulatory Visit: Payer: Self-pay

## 2020-08-20 ENCOUNTER — Ambulatory Visit: Payer: Medicare Other

## 2020-08-20 DIAGNOSIS — Z9889 Other specified postprocedural states: Secondary | ICD-10-CM | POA: Diagnosis not present

## 2020-08-20 DIAGNOSIS — I480 Paroxysmal atrial fibrillation: Secondary | ICD-10-CM | POA: Diagnosis not present

## 2020-08-20 DIAGNOSIS — Z5181 Encounter for therapeutic drug level monitoring: Secondary | ICD-10-CM

## 2020-08-20 DIAGNOSIS — Z952 Presence of prosthetic heart valve: Secondary | ICD-10-CM

## 2020-08-20 LAB — POCT INR: INR: 2.6 (ref 2.0–3.0)

## 2020-08-20 NOTE — Patient Instructions (Signed)
Continue on same dosage 5mg  daily except 2.5mg  on Mondays and Fridays. Repeat 9 weeks per patient.  Please call coumadin clinic with any questions or medication changes [phone].

## 2020-09-13 ENCOUNTER — Other Ambulatory Visit: Payer: Self-pay | Admitting: Cardiology

## 2020-10-13 ENCOUNTER — Other Ambulatory Visit: Payer: Self-pay | Admitting: Cardiology

## 2020-10-13 DIAGNOSIS — Z7901 Long term (current) use of anticoagulants: Secondary | ICD-10-CM

## 2020-10-13 DIAGNOSIS — Z952 Presence of prosthetic heart valve: Secondary | ICD-10-CM

## 2020-10-22 ENCOUNTER — Ambulatory Visit: Payer: Medicare Other

## 2020-10-22 ENCOUNTER — Other Ambulatory Visit: Payer: Self-pay

## 2020-10-22 DIAGNOSIS — Z9889 Other specified postprocedural states: Secondary | ICD-10-CM

## 2020-10-22 DIAGNOSIS — I480 Paroxysmal atrial fibrillation: Secondary | ICD-10-CM

## 2020-10-22 DIAGNOSIS — Z952 Presence of prosthetic heart valve: Secondary | ICD-10-CM

## 2020-10-22 DIAGNOSIS — Z5181 Encounter for therapeutic drug level monitoring: Secondary | ICD-10-CM

## 2020-10-22 LAB — POCT INR: INR: 3.3 — AB (ref 2.0–3.0)

## 2020-10-22 NOTE — Patient Instructions (Signed)
Continue on same dosage 5mg  daily except 2.5mg  on Mondays and Fridays. Repeat 4 weeks per patient.  Please call coumadin clinic with any questions or medication changes [phone].

## 2020-11-19 ENCOUNTER — Other Ambulatory Visit: Payer: Self-pay

## 2020-11-19 ENCOUNTER — Ambulatory Visit: Payer: Medicare Other

## 2020-11-19 DIAGNOSIS — Z5181 Encounter for therapeutic drug level monitoring: Secondary | ICD-10-CM

## 2020-11-19 DIAGNOSIS — I480 Paroxysmal atrial fibrillation: Secondary | ICD-10-CM

## 2020-11-19 LAB — POCT INR: INR: 3.3 — AB (ref 2.0–3.0)

## 2020-11-19 NOTE — Patient Instructions (Signed)
Description   Eat greens today and continue on same dosage 5mg  daily except 2.5mg  on Mondays and Fridays. Recheck INR in 4 weeks.  Please call coumadin clinic with any questions or medication changes [phone].

## 2020-11-29 ENCOUNTER — Other Ambulatory Visit: Payer: Self-pay | Admitting: Cardiology

## 2020-11-29 DIAGNOSIS — Z9889 Other specified postprocedural states: Secondary | ICD-10-CM

## 2020-12-17 ENCOUNTER — Other Ambulatory Visit: Payer: Self-pay

## 2020-12-17 ENCOUNTER — Ambulatory Visit: Payer: Medicare Other

## 2020-12-17 DIAGNOSIS — I480 Paroxysmal atrial fibrillation: Secondary | ICD-10-CM | POA: Diagnosis not present

## 2020-12-17 DIAGNOSIS — Z5181 Encounter for therapeutic drug level monitoring: Secondary | ICD-10-CM | POA: Diagnosis not present

## 2020-12-17 LAB — POCT INR: INR: 3 (ref 2.0–3.0)

## 2020-12-17 NOTE — Patient Instructions (Signed)
Description   Continue on same dosage 5mg  daily except 2.5mg  on Mondays and Fridays. Recheck INR in 5 weeks.  Please call coumadin clinic with any questions or medication changes [phone].

## 2021-01-12 ENCOUNTER — Other Ambulatory Visit: Payer: Self-pay | Admitting: Cardiology

## 2021-01-12 DIAGNOSIS — Z7901 Long term (current) use of anticoagulants: Secondary | ICD-10-CM

## 2021-01-12 DIAGNOSIS — Z952 Presence of prosthetic heart valve: Secondary | ICD-10-CM

## 2021-01-21 ENCOUNTER — Ambulatory Visit: Payer: Medicare Other

## 2021-01-21 ENCOUNTER — Other Ambulatory Visit: Payer: Self-pay

## 2021-01-21 DIAGNOSIS — I480 Paroxysmal atrial fibrillation: Secondary | ICD-10-CM

## 2021-01-21 DIAGNOSIS — Z5181 Encounter for therapeutic drug level monitoring: Secondary | ICD-10-CM

## 2021-01-21 DIAGNOSIS — Z9889 Other specified postprocedural states: Secondary | ICD-10-CM

## 2021-01-21 DIAGNOSIS — Z952 Presence of prosthetic heart valve: Secondary | ICD-10-CM

## 2021-01-21 LAB — POCT INR: INR: 2.5 (ref 2.0–3.0)

## 2021-01-21 NOTE — Patient Instructions (Signed)
Description   Continue on same dosage 5mg  daily except 2.5mg  on Mondays and Fridays. Recheck INR in 6 weeks.  Please call coumadin clinic with any questions or medication changes [phone].

## 2021-02-26 ENCOUNTER — Other Ambulatory Visit: Payer: Self-pay | Admitting: Cardiology

## 2021-03-04 ENCOUNTER — Ambulatory Visit: Payer: Medicare Other

## 2021-03-04 ENCOUNTER — Other Ambulatory Visit: Payer: Self-pay

## 2021-03-04 DIAGNOSIS — Z5181 Encounter for therapeutic drug level monitoring: Secondary | ICD-10-CM

## 2021-03-04 DIAGNOSIS — Z7901 Long term (current) use of anticoagulants: Secondary | ICD-10-CM | POA: Diagnosis not present

## 2021-03-04 DIAGNOSIS — I480 Paroxysmal atrial fibrillation: Secondary | ICD-10-CM | POA: Diagnosis not present

## 2021-03-04 LAB — POCT INR: INR: 2.2 (ref 2.0–3.0)

## 2021-03-04 NOTE — Patient Instructions (Signed)
Description   Continue on same dosage 5mg  daily except 2.5mg  on Mondays and Fridays. Recheck INR in 6 weeks.  Please call coumadin clinic with any questions or medication changes [phone].

## 2021-03-10 ENCOUNTER — Other Ambulatory Visit: Payer: Self-pay | Admitting: Cardiology

## 2021-03-10 DIAGNOSIS — I2510 Atherosclerotic heart disease of native coronary artery without angina pectoris: Secondary | ICD-10-CM

## 2021-04-10 ENCOUNTER — Other Ambulatory Visit: Payer: Self-pay | Admitting: Cardiology

## 2021-04-10 DIAGNOSIS — I2510 Atherosclerotic heart disease of native coronary artery without angina pectoris: Secondary | ICD-10-CM

## 2021-04-13 ENCOUNTER — Other Ambulatory Visit: Payer: Self-pay | Admitting: Cardiology

## 2021-04-13 DIAGNOSIS — Z7901 Long term (current) use of anticoagulants: Secondary | ICD-10-CM

## 2021-04-13 DIAGNOSIS — Z952 Presence of prosthetic heart valve: Secondary | ICD-10-CM

## 2021-04-15 ENCOUNTER — Ambulatory Visit: Payer: Medicare Other

## 2021-04-15 ENCOUNTER — Encounter: Payer: Self-pay | Admitting: Cardiology

## 2021-04-15 ENCOUNTER — Other Ambulatory Visit: Payer: Self-pay

## 2021-04-15 ENCOUNTER — Ambulatory Visit: Payer: Medicare Other | Admitting: Cardiology

## 2021-04-15 VITALS — BP 152/60 | HR 59 | Ht 69.0 in | Wt 155.0 lb

## 2021-04-15 DIAGNOSIS — Z7901 Long term (current) use of anticoagulants: Secondary | ICD-10-CM | POA: Diagnosis not present

## 2021-04-15 DIAGNOSIS — I2510 Atherosclerotic heart disease of native coronary artery without angina pectoris: Secondary | ICD-10-CM | POA: Diagnosis not present

## 2021-04-15 DIAGNOSIS — I480 Paroxysmal atrial fibrillation: Secondary | ICD-10-CM

## 2021-04-15 DIAGNOSIS — E782 Mixed hyperlipidemia: Secondary | ICD-10-CM

## 2021-04-15 DIAGNOSIS — Z9889 Other specified postprocedural states: Secondary | ICD-10-CM

## 2021-04-15 DIAGNOSIS — Z952 Presence of prosthetic heart valve: Secondary | ICD-10-CM | POA: Diagnosis not present

## 2021-04-15 DIAGNOSIS — Z5181 Encounter for therapeutic drug level monitoring: Secondary | ICD-10-CM

## 2021-04-15 DIAGNOSIS — I4891 Unspecified atrial fibrillation: Secondary | ICD-10-CM

## 2021-04-15 DIAGNOSIS — I10 Essential (primary) hypertension: Secondary | ICD-10-CM

## 2021-04-15 LAB — POCT INR: INR: 2.8 (ref 2.0–3.0)

## 2021-04-15 MED ORDER — AMOXICILLIN 500 MG PO TABS: 2000.0000 mg | ORAL_TABLET | ORAL | 1 refills | Status: DC

## 2021-04-15 NOTE — Patient Instructions (Signed)
Description   ?Continue on same dosage '5mg'$  daily except 2.'5mg'$  on Mondays and Fridays. Recheck INR in 6 weeks.  Please call coumadin clinic with any questions or medication changes [phone].  ? ?  ?   ?

## 2021-04-15 NOTE — Progress Notes (Signed)
?Cardiology Office Note:   ? ?Date:  04/15/2021  ? ?ID:  Erik Montgomery, DOB [birth year], MRN [id number] ? ?PCP:  Myrlene Broker, MD  ?Cardiologist:  Shirlee More, MD   ? ?Referring MD: Myrlene Broker, MD  ? ? ?ASSESSMENT:   ? ?1. S/P AVR   ?2. Long term (current) use of anticoagulants   ?3. H/O mitral valve repair   ?4. Coronary artery disease involving native coronary artery of native heart without angina pectoris   ?5. PAF (paroxysmal atrial fibrillation) ([place])   ?6. Chronic anticoagulation   ?7. Essential hypertension   ?8. Mixed dyslipidemia   ? ?PLAN:   ? ?In order of problems listed above: ? ?[name] continues to do well now 17 years remote from AVR remains on branded generic warfarin INR is remained in range no bleeding complications.  He will be given a prescription for endocarditis prophylaxis amoxicillin for dental visits. ?Continue warfarin managed in our clinic goal INR aortic position sinus rhythm 2 and half to 3 ?Good durable result from mitral valve repair ?Stable CAD New York Heart Association class I having no angina continue treatment including his beta-blocker high intensity statin ?No recurrence of atrial fibrillation ?BP at target continue treatment he takes telmisartan ?Continue statin with CAD ? ? ?Next appointment: 1 year ? ? ?Medication Adjustments/Labs and Tests Ordered: ?Current medicines are reviewed at length with the patient today.  Concerns regarding medicines are outlined above.  ?No orders of the defined types were placed in this encounter. ? ?No orders of the defined types were placed in this encounter. ? ? ?Chief Complaint  ?Patient presents with  ? Follow-up  ?  With a history of CAD remote CABG in 2006 with mitral valve repair at [facility] aortic valve replacement managed in my practices clinic  ? Anticoagulation  ? ? ?History of Present Illness:   ? ?Erik Montgomery is a 81 y.o. male with a hx of  CAD, Atrial Fibrillation, Dyslipidemia, HTN, S/P CABG with no obstructive  coronary stenosis and patent SVG to RCA at cath 2009 , Valvular heart disease with MV repair and a St Jude AVR in 2006 on warfarin  last seen 04/26/2020. ? ?Compliance with diet, lifestyle and medications: Yes ? ?Overall he is doing well remains active no bleeding from his anticoagulant no symptoms of TIA or stroke no chest pain edema shortness of breath. ?He is quite compliant and follow-up with anticoagulation ? ?His anticoagulation is generally been in the range ?Component Ref Range & Units 10:29 1 mo ago 2 mo ago 3 mo ago 4 mo ago 5 mo ago 7 mo ago  ?INR 2.0 - 3.0 2.8  2.2  2.5  3.0  3.3 Abnormal   3.3 Abnormal   2.6   ?0 ? ? ? ? ?7 ?Past Medical History:  ?Diagnosis Date  ? Chronic right shoulder pain 02/28/2015  ? Coronary artery disease involving native coronary artery of native heart without angina pectoris 04/25/2015  ? Overview:  Patent  SVG to RCA at cath 2009  ? Essential hypertension 09/02/2016  ? H/O mitral valve repair 04/25/2015  ? Heart valve replaced 10/12/2016  ? Hx of CABG 04/25/2015  ? Long term (current) use of anticoagulants 10/12/2016  ? Mixed dyslipidemia 09/02/2016  ? Mood disorder ([place]) 09/02/2016  ? PAF (paroxysmal atrial fibrillation) ([place]) 10/26/2016  ? S/P arthroscopy of shoulder 05/20/2015  ? S/P AVR 10/12/2016  ? ? ?Past Surgical History:  ?Procedure Laterality Date  ?  CARDIAC VALVE REPLACEMENT    ? CATARACT EXTRACTION, BILATERAL    ? ? ?Current Medications: ?Current Meds  ?Medication Sig  ? amLODipine (NORVASC) 5 MG tablet Take 1 tablet (5 mg total) by mouth daily.  ? amoxicillin (AMOXIL) 500 MG tablet Take 4 tablets (2,000 mg total) by mouth as directed. Take 4 tablets 30 mins prior to dental procedure.  ? Glucos-Chondroit-Hyaluron-MSM (GLUCOSAMINE CHONDROITIN JOINT PO) Take 1 tablet by mouth daily.  ? JANTOVEN 5 MG tablet TAKE 1/2 to 1 tablet AS DIRECTED BY COUMADIN CLINIC  ? metoprolol succinate (TOPROL-XL) 25 MG 24 hr tablet Take 0.5 tablets (12.5 mg total) by mouth daily.  ? nitroGLYCERIN  (NITROSTAT) 0.4 MG SL tablet Place 1 tablet (0.4 mg total) under the tongue every 5 (five) minutes as needed for chest pain.  ? Omega-3 Fatty Acids (FISH OIL) 1000 MG CPDR Take 1 capsule by mouth daily.  ? rosuvastatin (CRESTOR) 20 MG tablet Take 1 tablet (20 mg total) by mouth daily.  ? telmisartan (MICARDIS) 80 MG tablet Take 1 tablet (80 mg total) by mouth daily.  ?  ? ?Allergies:   Patient has no known allergies.  ? ?Social History  ? ?Socioeconomic History  ? Marital status: Single  ?  Spouse name: Not on file  ? Number of children: Not on file  ? Years of education: Not on file  ? Highest education level: Not on file  ?Occupational History  ? Not on file  ?Tobacco Use  ? Smoking status: Never  ?  Passive exposure: Never  ? Smokeless tobacco: Never  ?Vaping Use  ? Vaping Use: Never used  ?Substance and Sexual Activity  ? Alcohol use: No  ? Drug use: No  ? Sexual activity: Not on file  ?Other Topics Concern  ? Not on file  ?Social History Narrative  ? Not on file  ? ?Social Determinants of Health  ? ?Financial Resource Strain: Not on file  ?Food Insecurity: Not on file  ?Transportation Needs: Not on file  ?Physical Activity: Not on file  ?Stress: Not on file  ?Social Connections: Not on file  ?  ? ?Family History: ?The patient's family history includes Cancer in his father; Heart disease in his brother; Ovarian cancer in his mother. ?ROS:   ?Please see the history of present illness.    ?All other systems reviewed and are negative. ? ?EKGs/Labs/Other Studies Reviewed:   ? ?The following studies were reviewed today: ? ?EKG:  EKG ordered today and personally reviewed.  The ekg ordered today demonstrates sinus rhythm normal EKG ? ?Recent Labs: ? ?He had labs drawn this morning results pending ?No results found for requested labs within last 8760 hours.  ?Recent Lipid Panel ?   ?Component Value Date/Time  ? CHOL 137 11/04/2016 0845  ? TRIG 109 11/04/2016 0845  ? HDL 40 11/04/2016 0845  ? CHOLHDL 3.4 11/04/2016 0845   ? [place] 75 11/04/2016 0845  ? ? ?Physical Exam:   ? ?VS:  BP (!) 152/60 (BP Location: Right Arm)   Pulse (!) 59   Ht '5\' 9"'$  (1.753 m)   Wt 155 lb (70.3 kg)   SpO2 97%   BMI 22.89 kg/m?    ? ?Wt Readings from Last 3 Encounters:  ?04/15/21 155 lb (70.3 kg)  ?04/26/20 166 lb (75.3 kg)  ?10/05/19 161 lb 12.8 oz (73.4 kg)  ?  ? ?GEN:  Well nourished, well developed in no acute distress ?HEENT: Normal ?NECK: No JVD; No carotid bruits ?  LYMPHATICS: No lymphadenopathy ?CARDIAC: Sharp closing sound Saint Jude prosthesis 1 of 6 flow murmur no aortic regurgitation no mitral regurgitation RRR, no rubs, gallops ?RESPIRATORY:  Clear to auscultation without rales, wheezing or rhonchi  ?ABDOMEN: Soft, non-tender, non-distended ?MUSCULOSKELETAL:  No edema; No deformity  ?SKIN: Warm and dry ?NEUROLOGIC:  Alert and oriented x 3 ?PSYCHIATRIC:  Normal affect  ? ? ?Signed, ?Shirlee More, MD  ?04/15/2021 2:58 PM    ?[place]  ?

## 2021-04-15 NOTE — Patient Instructions (Signed)
Medication Instructions:  ?Your physician recommends that you continue on your current medications as directed. Please refer to the Current Medication list given to you today. ? ?*If you need a refill on your cardiac medications before your next appointment, please call your pharmacy* ? ? ?Lab Work: ?NONE ?If you have labs (blood work) drawn today and your tests are completely normal, you will receive your results only by: ?MyChart Message (if you have MyChart) OR ?A paper copy in the mail ?If you have any lab test that is abnormal or we need to change your treatment, we will call you to review the results. ? ? ?Testing/Procedures: ?NONE ? ? ?Follow-Up: ?At [facility], you and your health needs are our priority.  As part of our continuing mission to provide you with exceptional heart care, we have created designated Provider Care Teams.  These Care Teams include your primary Cardiologist (physician) and Advanced Practice Providers (APPs -  Physician Assistants and Nurse Practitioners) who all work together to provide you with the care you need, when you need it. ? ?We recommend signing up for the patient portal called "MyChart".  Sign up information is provided on this After Visit Summary.  MyChart is used to connect with patients for Virtual Visits (Telemedicine).  Patients are able to view lab/test results, encounter notes, upcoming appointments, etc.  Non-urgent messages can be sent to your provider as well.   ?To learn more about what you can do with MyChart, go to [email or web address].   ? ?Your next appointment:   ?1 year(s) ? ?The format for your next appointment:   ?In Person ? ?Provider:   ?Brian Munley, MD  ? ? ?Other Instructions ?  ?

## 2021-05-27 ENCOUNTER — Other Ambulatory Visit: Payer: Self-pay | Admitting: Cardiology

## 2021-05-27 ENCOUNTER — Ambulatory Visit: Payer: Medicare Other

## 2021-05-27 DIAGNOSIS — Z5181 Encounter for therapeutic drug level monitoring: Secondary | ICD-10-CM

## 2021-05-27 DIAGNOSIS — I4891 Unspecified atrial fibrillation: Secondary | ICD-10-CM | POA: Diagnosis not present

## 2021-05-27 DIAGNOSIS — I480 Paroxysmal atrial fibrillation: Secondary | ICD-10-CM

## 2021-05-27 DIAGNOSIS — Z7901 Long term (current) use of anticoagulants: Secondary | ICD-10-CM

## 2021-05-27 DIAGNOSIS — I2510 Atherosclerotic heart disease of native coronary artery without angina pectoris: Secondary | ICD-10-CM

## 2021-05-27 LAB — POCT INR: INR: 3.5 — AB (ref 2.0–3.0)

## 2021-05-27 NOTE — Patient Instructions (Signed)
Description   ?Hold today's dose and then continue on same dosage '5mg'$  daily except 2.'5mg'$  on Mondays and Fridays.  ?Recheck INR in 5 weeks.  ?Please call coumadin clinic with any questions or medication changes [phone].  ? ?  ?   ?

## 2021-07-01 ENCOUNTER — Ambulatory Visit: Payer: Medicare Other

## 2021-07-01 DIAGNOSIS — Z7901 Long term (current) use of anticoagulants: Secondary | ICD-10-CM | POA: Diagnosis not present

## 2021-07-01 DIAGNOSIS — Z5181 Encounter for therapeutic drug level monitoring: Secondary | ICD-10-CM

## 2021-07-01 DIAGNOSIS — I480 Paroxysmal atrial fibrillation: Secondary | ICD-10-CM | POA: Diagnosis not present

## 2021-07-01 DIAGNOSIS — I4891 Unspecified atrial fibrillation: Secondary | ICD-10-CM

## 2021-07-01 LAB — POCT INR: INR: 2.6 (ref 2.0–3.0)

## 2021-07-01 NOTE — Patient Instructions (Signed)
Description   Continue on same dosage '5mg'$  daily except 2.'5mg'$  on Mondays and Fridays.  Recheck INR in 6 weeks.  Please call coumadin clinic with any questions or medication changes [phone].

## 2021-07-24 ENCOUNTER — Other Ambulatory Visit: Payer: Self-pay | Admitting: Cardiology

## 2021-07-24 DIAGNOSIS — Z7901 Long term (current) use of anticoagulants: Secondary | ICD-10-CM

## 2021-07-24 DIAGNOSIS — Z952 Presence of prosthetic heart valve: Secondary | ICD-10-CM

## 2021-07-24 NOTE — Telephone Encounter (Signed)
Telmisartan 80 mg # 90 tablets sent to [place], [place]

## 2021-08-12 ENCOUNTER — Ambulatory Visit: Payer: Medicare Other

## 2021-08-12 DIAGNOSIS — Z5181 Encounter for therapeutic drug level monitoring: Secondary | ICD-10-CM | POA: Diagnosis not present

## 2021-08-12 DIAGNOSIS — Z7901 Long term (current) use of anticoagulants: Secondary | ICD-10-CM

## 2021-08-12 DIAGNOSIS — I480 Paroxysmal atrial fibrillation: Secondary | ICD-10-CM

## 2021-08-12 DIAGNOSIS — I4891 Unspecified atrial fibrillation: Secondary | ICD-10-CM | POA: Diagnosis not present

## 2021-08-12 LAB — POCT INR: INR: 2.5 (ref 2.0–3.0)

## 2021-08-12 NOTE — Patient Instructions (Addendum)
Description   Continue on same dosage '5mg'$  daily except 2.'5mg'$  on Mondays and Fridays.  Recheck INR in 7 weeks.  Please call coumadin clinic with any questions or medication changes [phone].

## 2021-09-30 ENCOUNTER — Ambulatory Visit: Payer: Medicare Other | Attending: Cardiology

## 2021-09-30 DIAGNOSIS — I4891 Unspecified atrial fibrillation: Secondary | ICD-10-CM | POA: Diagnosis not present

## 2021-09-30 DIAGNOSIS — Z5181 Encounter for therapeutic drug level monitoring: Secondary | ICD-10-CM | POA: Diagnosis not present

## 2021-09-30 DIAGNOSIS — Z7901 Long term (current) use of anticoagulants: Secondary | ICD-10-CM

## 2021-09-30 DIAGNOSIS — I480 Paroxysmal atrial fibrillation: Secondary | ICD-10-CM | POA: Diagnosis not present

## 2021-09-30 LAB — POCT INR: INR: 2.4 (ref 2.0–3.0)

## 2021-09-30 NOTE — Patient Instructions (Signed)
Description   Continue on same dosage 5mg daily except 2.5mg on Mondays and Fridays.  Recheck INR in 7 weeks.  Please call coumadin clinic with any questions or medication changes  Coumadin Clinic: [phone].        

## 2021-10-21 ENCOUNTER — Other Ambulatory Visit: Payer: Self-pay | Admitting: Cardiology

## 2021-10-21 DIAGNOSIS — Z7901 Long term (current) use of anticoagulants: Secondary | ICD-10-CM

## 2021-10-21 DIAGNOSIS — Z952 Presence of prosthetic heart valve: Secondary | ICD-10-CM

## 2021-11-18 ENCOUNTER — Ambulatory Visit: Payer: Medicare Other | Attending: Cardiology

## 2021-11-18 DIAGNOSIS — Z7901 Long term (current) use of anticoagulants: Secondary | ICD-10-CM | POA: Diagnosis not present

## 2021-11-18 DIAGNOSIS — I480 Paroxysmal atrial fibrillation: Secondary | ICD-10-CM

## 2021-11-18 DIAGNOSIS — Z5181 Encounter for therapeutic drug level monitoring: Secondary | ICD-10-CM | POA: Diagnosis not present

## 2021-11-18 DIAGNOSIS — I4891 Unspecified atrial fibrillation: Secondary | ICD-10-CM

## 2021-11-18 LAB — POCT INR: INR: 2.7 (ref 2.0–3.0)

## 2021-11-18 NOTE — Patient Instructions (Signed)
Description   Take 1.5 tablets today and then continue on same dosage '5mg'$  daily except 2.'5mg'$  on Mondays and Fridays.  Recheck INR in 7 weeks.  Please call coumadin clinic with any questions or medication changes  Coumadin Clinic: [phone].

## 2021-12-07 ENCOUNTER — Other Ambulatory Visit: Payer: Self-pay | Admitting: Cardiology

## 2021-12-07 DIAGNOSIS — I2510 Atherosclerotic heart disease of native coronary artery without angina pectoris: Secondary | ICD-10-CM

## 2021-12-08 NOTE — Telephone Encounter (Signed)
Rx refill sent to pharmacy. 

## 2022-01-06 ENCOUNTER — Ambulatory Visit: Payer: Medicare Other | Attending: Cardiology

## 2022-01-06 DIAGNOSIS — I4891 Unspecified atrial fibrillation: Secondary | ICD-10-CM | POA: Diagnosis not present

## 2022-01-06 DIAGNOSIS — Z5181 Encounter for therapeutic drug level monitoring: Secondary | ICD-10-CM | POA: Diagnosis not present

## 2022-01-06 DIAGNOSIS — I480 Paroxysmal atrial fibrillation: Secondary | ICD-10-CM | POA: Diagnosis not present

## 2022-01-06 DIAGNOSIS — Z7901 Long term (current) use of anticoagulants: Secondary | ICD-10-CM

## 2022-01-06 LAB — POCT INR: INR: 3 (ref 2.0–3.0)

## 2022-01-06 NOTE — Patient Instructions (Signed)
Description   Continue on same dosage '5mg'$  daily except 2.'5mg'$  on Mondays and Fridays.  Recheck INR in 7 weeks.  Please call coumadin clinic with any questions or medication changes  Coumadin Clinic: [phone].

## 2022-01-20 ENCOUNTER — Other Ambulatory Visit: Payer: Self-pay | Admitting: Cardiology

## 2022-01-20 DIAGNOSIS — Z7901 Long term (current) use of anticoagulants: Secondary | ICD-10-CM

## 2022-01-20 DIAGNOSIS — Z952 Presence of prosthetic heart valve: Secondary | ICD-10-CM

## 2022-02-24 ENCOUNTER — Ambulatory Visit: Payer: PPO | Attending: Cardiology

## 2022-02-24 DIAGNOSIS — Z5181 Encounter for therapeutic drug level monitoring: Secondary | ICD-10-CM

## 2022-02-24 DIAGNOSIS — Z7901 Long term (current) use of anticoagulants: Secondary | ICD-10-CM | POA: Diagnosis not present

## 2022-02-24 DIAGNOSIS — I480 Paroxysmal atrial fibrillation: Secondary | ICD-10-CM | POA: Diagnosis not present

## 2022-02-24 LAB — POCT INR: INR: 3.2 — AB (ref 2.0–3.0)

## 2022-02-24 NOTE — Patient Instructions (Signed)
Description   Only take 1/2 tablet today and then continue on same dosage '5mg'$  daily except 2.'5mg'$  on Mondays and Fridays.  Recheck INR in 6 weeks.  Please call coumadin clinic with any questions or medication changes  Coumadin Clinic: [phone].

## 2022-03-03 ENCOUNTER — Other Ambulatory Visit: Payer: Self-pay

## 2022-03-03 DIAGNOSIS — I2510 Atherosclerotic heart disease of native coronary artery without angina pectoris: Secondary | ICD-10-CM

## 2022-03-03 MED ORDER — TELMISARTAN 80 MG PO TABS: 80.0000 mg | ORAL_TABLET | Freq: Every day | ORAL | 1 refills | Status: DC

## 2022-03-03 MED ORDER — NITROGLYCERIN 0.4 MG SL SUBL: 0.4000 mg | SUBLINGUAL_TABLET | SUBLINGUAL | 1 refills | Status: AC | PRN

## 2022-03-03 MED ORDER — ROSUVASTATIN CALCIUM 20 MG PO TABS: 20.0000 mg | ORAL_TABLET | Freq: Every day | ORAL | 1 refills | Status: DC

## 2022-03-03 MED ORDER — METOPROLOL SUCCINATE ER 25 MG PO TB24: 12.5000 mg | ORAL_TABLET | Freq: Every day | ORAL | 1 refills | Status: DC

## 2022-03-07 ENCOUNTER — Other Ambulatory Visit: Payer: Self-pay | Admitting: Cardiology

## 2022-03-09 NOTE — Telephone Encounter (Signed)
Rx refill sent to pharmacy. 

## 2022-03-23 ENCOUNTER — Other Ambulatory Visit: Payer: Self-pay | Admitting: Cardiology

## 2022-03-23 DIAGNOSIS — I2510 Atherosclerotic heart disease of native coronary artery without angina pectoris: Secondary | ICD-10-CM

## 2022-03-23 NOTE — Telephone Encounter (Signed)
Rx refill sent to pharmacy. 

## 2022-04-07 ENCOUNTER — Ambulatory Visit: Payer: Medicare Other | Attending: Cardiology

## 2022-04-07 DIAGNOSIS — I480 Paroxysmal atrial fibrillation: Secondary | ICD-10-CM

## 2022-04-07 DIAGNOSIS — Z5181 Encounter for therapeutic drug level monitoring: Secondary | ICD-10-CM | POA: Diagnosis not present

## 2022-04-07 DIAGNOSIS — Z7901 Long term (current) use of anticoagulants: Secondary | ICD-10-CM

## 2022-04-07 LAB — POCT INR: INR: 4 — AB (ref 2.0–3.0)

## 2022-04-07 NOTE — Patient Instructions (Signed)
Description   HOLD today's dose and then START taking '5mg'$  daily except 2.'5mg'$  on Mondays, Wednesday and Fridays.  Recheck INR in 3 weeks.  Please call coumadin clinic with any questions or medication changes  Coumadin Clinic: [phone].

## 2022-04-09 ENCOUNTER — Other Ambulatory Visit: Payer: Self-pay | Admitting: Cardiology

## 2022-04-09 DIAGNOSIS — Z9889 Other specified postprocedural states: Secondary | ICD-10-CM

## 2022-04-09 NOTE — Telephone Encounter (Signed)
Refill to pharmacy 

## 2022-04-20 ENCOUNTER — Other Ambulatory Visit: Payer: Self-pay | Admitting: Cardiology

## 2022-04-20 NOTE — Telephone Encounter (Signed)
Rx refill sent to pharmacy. 

## 2022-04-28 ENCOUNTER — Ambulatory Visit: Payer: Medicare Other | Attending: Cardiology

## 2022-04-28 DIAGNOSIS — Z5181 Encounter for therapeutic drug level monitoring: Secondary | ICD-10-CM | POA: Diagnosis not present

## 2022-04-28 DIAGNOSIS — I4891 Unspecified atrial fibrillation: Secondary | ICD-10-CM

## 2022-04-28 DIAGNOSIS — I480 Paroxysmal atrial fibrillation: Secondary | ICD-10-CM | POA: Diagnosis not present

## 2022-04-28 DIAGNOSIS — Z7901 Long term (current) use of anticoagulants: Secondary | ICD-10-CM | POA: Diagnosis not present

## 2022-04-28 LAB — POCT INR: INR: 3.1 — AB (ref 2.0–3.0)

## 2022-04-28 NOTE — Patient Instructions (Signed)
Description   Eat greens today and continue taking 5mg  daily except 2.5mg  on Mondays, Wednesday and Fridays.  Recheck INR in 3 weeks.  Please call coumadin clinic with any questions or medication changes  Coumadin Clinic: [phone].

## 2022-05-05 ENCOUNTER — Other Ambulatory Visit: Payer: Self-pay | Admitting: Cardiology

## 2022-05-19 ENCOUNTER — Other Ambulatory Visit: Payer: Self-pay | Admitting: Cardiology

## 2022-05-19 ENCOUNTER — Ambulatory Visit: Payer: Medicare Other | Attending: Cardiology

## 2022-05-19 DIAGNOSIS — I480 Paroxysmal atrial fibrillation: Secondary | ICD-10-CM

## 2022-05-19 DIAGNOSIS — Z5181 Encounter for therapeutic drug level monitoring: Secondary | ICD-10-CM

## 2022-05-19 LAB — POCT INR: INR: 2.7 (ref 2.0–3.0)

## 2022-05-19 NOTE — Patient Instructions (Signed)
Description   Continue taking  daily except 2.5mg  on Mondays, Wednesday and Fridays.  Recheck INR in 4 weeks.  Please call coumadin clinic with any questions or medication changes  Coumadin Clinic: [phone].

## 2022-05-27 NOTE — Progress Notes (Unsigned)
Cardiology Office Note:    Date:  05/28/2022   ID:  Erik Montgomery, DOB 06/09/1940, MRN [id number]  PCP:  Hadley Pen, MD  Cardiologist:  Norman Herrlich, MD    Referring MD: Hadley Pen, MD    ASSESSMENT:    1. S/P AVR   2. H/O mitral valve repair   3. Coronary artery disease involving native coronary artery of native heart without angina pectoris   4. Hx of CABG   5. PAF (paroxysmal atrial fibrillation)   6. Long term (current) use of anticoagulants   7. Essential hypertension   8. Mixed dyslipidemia    PLAN:    In order of problems listed above:  Per cardiology perspective he has done well long-term after mechanical AVR CABG with mitral valve repair and CABG in the setting of cardiovascular symptoms Continue warfarin managed in our anticoagulation clinic Continue his beta-blocker lipid-lowering therapy with CAD Blood pressure control continue his ARB Labs and follow-up in his PCP office   Next appointment: 1 year   Medication Adjustments/Labs and Tests Ordered: Current medicines are reviewed at length with the patient today.  Concerns regarding medicines are outlined above.  No orders of the defined types were placed in this encounter.  No orders of the defined types were placed in this encounter.   Chief Complaint  Patient presents with   Follow-up    History of Present Illness:    Erik Montgomery is a 82 y.o. male with a hx of valvular heart disease mitral valve repair CABG and Saint Jude AVR in 2006 with long-term warfarin anticoagulation CAD paroxysmal atrial fibrillation dyslipidemia hypertension last seen 04/15/2021.  Compliance with diet, lifestyle and medications: Yes  He feels well remains very active.  Basis and is having cardiovascular symptoms of edema shortness of breath chest pain palpitation or syncope Follows an anticoagulant program for management of  Recent labs with his PCP 12/16/2021: Hemoglobin 9.8 hematocrit 28.4  creatinine 1.75 potassium 4.5 sodium 141 cholesterol 109 LDL 63 triglycerides 115 HDL 31 Past Medical History:  Diagnosis Date   Chronic right shoulder pain 02/28/2015   Coronary artery disease involving native coronary artery of native heart without angina pectoris 04/25/2015   Overview:  Patent  SVG to RCA at cath 2009   Essential hypertension 09/02/2016   H/O mitral valve repair 04/25/2015   Heart valve replaced 10/12/2016   Hx of CABG 04/25/2015   Long term (current) use of anticoagulants 10/12/2016   Mixed dyslipidemia 09/02/2016   Mood disorder 09/02/2016   PAF (paroxysmal atrial fibrillation) 10/26/2016   S/P arthroscopy of shoulder 05/20/2015   S/P AVR 10/12/2016    Past Surgical History:  Procedure Laterality Date   CARDIAC VALVE REPLACEMENT     CATARACT EXTRACTION, BILATERAL      Current Medications: Current Meds  Medication Sig   amLODipine (NORVASC) 5 MG tablet Take 1 tablet (5 mg total) by mouth daily.   amoxicillin (AMOXIL) 500 MG tablet Take 4 tablets (2,000 mg total) by mouth as directed. Take 4 tablets 30 mins prior to dental procedure.   Glucos-Chondroit-Hyaluron-MSM (GLUCOSAMINE CHONDROITIN JOINT PO) Take 1 tablet by mouth daily.   JANTOVEN 5 MG tablet TAKE 1/2 to 1 tablet AS DIRECTED BY COUMADIN CLINIC   metoprolol succinate (TOPROL-XL) 25 MG 24 hr tablet Take 0.5 tablets (12.5 mg total) by mouth daily.   nitroGLYCERIN (NITROSTAT) 0.4 MG SL tablet Place 1 tablet (0.4 mg total) under the tongue every 5 (five) minutes as needed  for chest pain.   Omega-3 Fatty Acids (FISH OIL) 1000 MG CPDR Take 1 capsule by mouth daily.   rosuvastatin (CRESTOR) 20 MG tablet Take 1 tablet (20 mg total) by mouth daily.   telmisartan (MICARDIS) 80 MG tablet Take 1 tablet (80 mg total) by mouth daily.     Allergies:   Patient has no known allergies.   Social History   Socioeconomic History   Marital status: Single    Spouse name: Not on file   Number of children: Not on file   Years of  education: Not on file   Highest education level: Not on file  Occupational History   Not on file  Tobacco Use   Smoking status: Never    Passive exposure: Never   Smokeless tobacco: Never  Vaping Use   Vaping Use: Never used  Substance and Sexual Activity   Alcohol use: No   Drug use: No   Sexual activity: Not on file  Other Topics Concern   Not on file  Social History Narrative   Not on file   Social Determinants of Health   Financial Resource Strain: Not on file  Food Insecurity: Not on file  Transportation Needs: Not on file  Physical Activity: Not on file  Stress: Not on file  Social Connections: Not on file     Family History: The patient's family history includes Cancer in his father; Heart disease in his brother; Ovarian cancer in his mother. ROS:   Please see the history of present illness.    All other systems reviewed and are negative.  EKGs/Labs/Other Studies Reviewed:    The following studies were reviewed today:      EKG:  EKG ordered today and personally reviewed.  The ekg ordered today demonstrates sinus rhythm normal  Recent Labs: No results found for requested labs within last 365 days.  Recent Lipid Panel    Component Value Date/Time   CHOL 137 11/04/2016 0845   TRIG 109 11/04/2016 0845   HDL 40 11/04/2016 0845   CHOLHDL 3.4 11/04/2016 0845   LDLCALC 75 11/04/2016 0845    Physical Exam:    VS:  BP (!) 142/62 (BP Location: Right Arm, Cuff Size: Normal)   Pulse 60   Ht  (1.753 m)   Wt 155 lb 12.8 oz (70.7 kg)   SpO2 98%   BMI 23.01 kg/m     Wt Readings from Last 3 Encounters:  05/28/22 155 lb 12.8 oz (70.7 kg)  04/15/21 155 lb (70.3 kg)  04/26/20 166 lb (75.3 kg)     GEN:  Well nourished, well developed in no acute distress HEENT: Normal NECK: No JVD; No carotid bruits LYMPHATICS: No lymphadenopathy CARDIAC: Sharp closing snap AVR 1/6 aortic ejection murmur no AR RRR, no murmurs, rubs, gallops RESPIRATORY:  Clear to  auscultation without rales, wheezing or rhonchi  ABDOMEN: Soft, non-tender, non-distended MUSCULOSKELETAL:  No edema; No deformity  SKIN: Warm and dry NEUROLOGIC:  Alert and oriented x 3 PSYCHIATRIC:  Normal affect    Signed, Norman Herrlich, MD  05/28/2022 3:14 PM    [facility]

## 2022-05-28 ENCOUNTER — Ambulatory Visit: Payer: Medicare Other | Attending: Cardiology | Admitting: Cardiology

## 2022-05-28 ENCOUNTER — Encounter: Payer: Self-pay | Admitting: Cardiology

## 2022-05-28 VITALS — BP 142/62 | HR 60 | Ht 69.0 in | Wt 155.8 lb

## 2022-05-28 DIAGNOSIS — I2510 Atherosclerotic heart disease of native coronary artery without angina pectoris: Secondary | ICD-10-CM | POA: Diagnosis not present

## 2022-05-28 DIAGNOSIS — Z952 Presence of prosthetic heart valve: Secondary | ICD-10-CM

## 2022-05-28 DIAGNOSIS — Z9889 Other specified postprocedural states: Secondary | ICD-10-CM | POA: Diagnosis not present

## 2022-05-28 DIAGNOSIS — Z951 Presence of aortocoronary bypass graft: Secondary | ICD-10-CM

## 2022-05-28 DIAGNOSIS — E782 Mixed hyperlipidemia: Secondary | ICD-10-CM

## 2022-05-28 DIAGNOSIS — I480 Paroxysmal atrial fibrillation: Secondary | ICD-10-CM

## 2022-05-28 DIAGNOSIS — Z7901 Long term (current) use of anticoagulants: Secondary | ICD-10-CM

## 2022-05-28 DIAGNOSIS — I10 Essential (primary) hypertension: Secondary | ICD-10-CM

## 2022-05-28 NOTE — Patient Instructions (Signed)
Medication Instructions:  Your physician recommends that you continue on your current medications as directed. Please refer to the Current Medication list given to you today.  Continue to take your Glucosamine and fish oil.  *If you need a refill on your cardiac medications before your next appointment, please call your pharmacy*   Lab Work: None ordered If you have labs (blood work) drawn today and your tests are completely normal, you will receive your results only by: MyChart Message (if you have MyChart) OR A paper copy in the mail If you have any lab test that is abnormal or we need to change your treatment, we will call you to review the results.   Testing/Procedures: None ordered   Follow-Up: At [facility], you and your health needs are our priority.  As part of our continuing mission to provide you with exceptional heart care, we have created designated Provider Care Teams.  These Care Teams include your primary Cardiologist (physician) and Advanced Practice Providers (APPs -  Physician Assistants and Nurse Practitioners) who all work together to provide you with the care you need, when you need it.  We recommend signing up for the patient portal called "MyChart".  Sign up information is provided on this After Visit Summary.  MyChart is used to connect with patients for Virtual Visits (Telemedicine).  Patients are able to view lab/test results, encounter notes, upcoming appointments, etc.  Non-urgent messages can be sent to your provider as well.   To learn more about what you can do with MyChart, go to [email or web address].    Your next appointment:   12 month(s)  The format for your next appointment:   In Person  Provider:   Norman Herrlich, MD    Other Instructions none  Important Information About Sugar

## 2022-06-12 ENCOUNTER — Other Ambulatory Visit: Payer: Self-pay | Admitting: Cardiology

## 2022-06-12 DIAGNOSIS — I2510 Atherosclerotic heart disease of native coronary artery without angina pectoris: Secondary | ICD-10-CM

## 2022-06-12 NOTE — Telephone Encounter (Signed)
Refills to pharmacy 

## 2022-06-16 ENCOUNTER — Ambulatory Visit: Payer: Medicare Other | Attending: Cardiology

## 2022-06-16 DIAGNOSIS — I480 Paroxysmal atrial fibrillation: Secondary | ICD-10-CM | POA: Diagnosis not present

## 2022-06-16 DIAGNOSIS — Z5181 Encounter for therapeutic drug level monitoring: Secondary | ICD-10-CM

## 2022-06-16 LAB — POCT INR: INR: 3.1 — AB (ref 2.0–3.0)

## 2022-06-16 NOTE — Patient Instructions (Signed)
Description   Eat a serving of greens today and then START taking 5mg  daily except 2.5mg  on Sundays, Mondays, Wednesday and Fridays.  Recheck INR in 3 weeks.  Please call coumadin clinic with any questions or medication changes  Coumadin Clinic: [phone].

## 2022-06-19 ENCOUNTER — Other Ambulatory Visit: Payer: Self-pay | Admitting: Cardiology

## 2022-06-19 NOTE — Telephone Encounter (Signed)
Rx sent to pharmacy   

## 2022-07-07 ENCOUNTER — Ambulatory Visit: Payer: Medicare Other | Attending: Cardiology

## 2022-07-07 DIAGNOSIS — I480 Paroxysmal atrial fibrillation: Secondary | ICD-10-CM

## 2022-07-07 DIAGNOSIS — Z5181 Encounter for therapeutic drug level monitoring: Secondary | ICD-10-CM | POA: Diagnosis not present

## 2022-07-07 DIAGNOSIS — Z7901 Long term (current) use of anticoagulants: Secondary | ICD-10-CM

## 2022-07-07 LAB — POCT INR: INR: 2.1 (ref 2.0–3.0)

## 2022-07-07 NOTE — Patient Instructions (Signed)
Continue taking 5mg  daily except 2.5mg  on Sundays, Mondays, Wednesday and Fridays.  Recheck INR in 5 weeks.  Please call coumadin clinic with any questions or medication changes  Coumadin Clinic: [phone].

## 2022-08-11 ENCOUNTER — Ambulatory Visit: Payer: Medicare Other | Attending: Cardiology

## 2022-08-11 DIAGNOSIS — I480 Paroxysmal atrial fibrillation: Secondary | ICD-10-CM | POA: Diagnosis not present

## 2022-08-11 DIAGNOSIS — Z5181 Encounter for therapeutic drug level monitoring: Secondary | ICD-10-CM

## 2022-08-11 LAB — POCT INR: INR: 2.3 (ref 2.0–3.0)

## 2022-08-11 NOTE — Patient Instructions (Signed)
Description   Continue taking 5mg  daily except 2.5mg  on Sundays, Mondays, Wednesday and Fridays.  Recheck INR in 6 weeks.  Please call coumadin clinic with any questions or medication changes  Coumadin Clinic: [phone].

## 2022-09-22 ENCOUNTER — Ambulatory Visit: Payer: Medicare Other | Attending: Cardiology

## 2022-09-22 DIAGNOSIS — I480 Paroxysmal atrial fibrillation: Secondary | ICD-10-CM

## 2022-09-22 DIAGNOSIS — Z5181 Encounter for therapeutic drug level monitoring: Secondary | ICD-10-CM

## 2022-09-22 LAB — POCT INR: INR: 2.5 (ref 2.0–3.0)

## 2022-09-22 NOTE — Patient Instructions (Signed)
Description   Continue taking 5mg  daily except 2.5mg  on Sundays, Mondays, Wednesday and Fridays.  Recheck INR in 6 weeks.  Please call coumadin clinic with any questions or medication changes  Coumadin Clinic: [phone].

## 2022-09-23 ENCOUNTER — Telehealth: Payer: Self-pay

## 2022-09-23 DIAGNOSIS — Z9889 Other specified postprocedural states: Secondary | ICD-10-CM

## 2022-09-23 NOTE — Telephone Encounter (Signed)
   Pre-operative Risk Assessment    Patient Name: Erik Montgomery  DOB: 1940-10-15 MRN: [id number]     Request for Surgical Clearance    Procedure:  Dental Extraction - Amount of Teeth to be Pulled:  1  Date of Surgery:  Clearance TBD                                 Surgeon:   Surgeon's Group or Practice Name:  Dr. Angela Adam and Dr. Zena Amos Phone number:  [phone] Fax number:  [phone]      Type of Clearance Requested:   - Pharmacy:  Hold Warfarin (Coumadin) please advise   Type of Anesthesia:  Not Indicated   Additional requests/questions:    [name]   09/23/2022, 4:26 PM

## 2022-09-24 MED ORDER — AMOXICILLIN 500 MG PO TABS
2000.0000 mg | ORAL_TABLET | ORAL | 1 refills | Status: AC
Start: 2022-09-24 — End: ?

## 2022-09-24 NOTE — Telephone Encounter (Signed)
   Patient Name: Erik Montgomery  DOB: 15-Aug-1940 MRN: [id number]  Primary Cardiologist: Norman Herrlich, MD  Chart reviewed as part of pre-operative protocol coverage.   Simple dental extractions (i.e. 1-2 teeth) are considered low risk procedures per guidelines and generally do not require any specific cardiac clearance. It is also generally accepted that for simple extractions and dental cleanings, there is no need to interrupt blood thinner therapy.  SBE prophylaxis is required for the patient from a cardiac standpoint.  Please advise patient that a prescription for amoxicillin (AMOXIL) 500 MG tablet Take 4 tablets (2,000 mg total) has been sent to the pharmacy on file.   I will route this recommendation to the requesting party via Epic fax function and remove from pre-op pool.  Please call with questions.  Napoleon Form, Leodis Rains, NP 09/24/2022, 8:31 AM

## 2022-11-03 ENCOUNTER — Ambulatory Visit: Payer: Medicare Other | Attending: Cardiology

## 2022-11-03 DIAGNOSIS — I4891 Unspecified atrial fibrillation: Secondary | ICD-10-CM

## 2022-11-03 DIAGNOSIS — I480 Paroxysmal atrial fibrillation: Secondary | ICD-10-CM | POA: Diagnosis not present

## 2022-11-03 DIAGNOSIS — Z5181 Encounter for therapeutic drug level monitoring: Secondary | ICD-10-CM | POA: Diagnosis not present

## 2022-11-03 DIAGNOSIS — Z7901 Long term (current) use of anticoagulants: Secondary | ICD-10-CM

## 2022-11-03 LAB — POCT INR: INR: 2 (ref 2.0–3.0)

## 2022-11-03 NOTE — Patient Instructions (Signed)
Description   Take 1.5 tablets today and then continue taking 5mg  daily except 2.5mg  on Sundays, Mondays, Wednesday and Fridays.  Recheck INR in 6 weeks.  Please call coumadin clinic with any questions or medication changes  Coumadin Clinic: [phone].

## 2022-12-15 ENCOUNTER — Ambulatory Visit: Payer: Medicare Other | Attending: Cardiology

## 2022-12-15 DIAGNOSIS — I480 Paroxysmal atrial fibrillation: Secondary | ICD-10-CM

## 2022-12-15 DIAGNOSIS — Z5181 Encounter for therapeutic drug level monitoring: Secondary | ICD-10-CM

## 2022-12-15 DIAGNOSIS — I4891 Unspecified atrial fibrillation: Secondary | ICD-10-CM

## 2022-12-15 DIAGNOSIS — Z952 Presence of prosthetic heart valve: Secondary | ICD-10-CM

## 2022-12-15 DIAGNOSIS — Z7901 Long term (current) use of anticoagulants: Secondary | ICD-10-CM

## 2022-12-15 LAB — POCT INR: INR: 2.2 (ref 2.0–3.0)

## 2022-12-15 MED ORDER — JANTOVEN 5 MG PO TABS
ORAL_TABLET | ORAL | 0 refills | Status: DC
Start: 2022-12-15 — End: 2023-03-16

## 2022-12-15 NOTE — Patient Instructions (Signed)
Description   Continue taking 5mg  daily except 2.5mg  on Sundays, Mondays, Wednesday and Fridays.  Recheck INR in 6 weeks.  Please call coumadin clinic with any questions or medication changes  Coumadin Clinic: [phone].

## 2023-02-09 ENCOUNTER — Ambulatory Visit: Payer: Medicare Other | Attending: Cardiology

## 2023-02-09 DIAGNOSIS — Z5181 Encounter for therapeutic drug level monitoring: Secondary | ICD-10-CM

## 2023-02-09 DIAGNOSIS — I480 Paroxysmal atrial fibrillation: Secondary | ICD-10-CM

## 2023-02-09 LAB — POCT INR: INR: 2 (ref 2.0–3.0)

## 2023-02-09 NOTE — Patient Instructions (Signed)
Description   Take 1.5 tablets today and then continue taking 5mg  daily except 2.5mg  on Sundays, Mondays, Wednesday and Fridays.  Recheck INR in 6 weeks.  Please call coumadin clinic with any questions or medication changes  Coumadin Clinic: [phone].

## 2023-03-11 ENCOUNTER — Other Ambulatory Visit: Payer: Self-pay | Admitting: Cardiology

## 2023-03-11 DIAGNOSIS — I2510 Atherosclerotic heart disease of native coronary artery without angina pectoris: Secondary | ICD-10-CM

## 2023-03-16 ENCOUNTER — Other Ambulatory Visit: Payer: Self-pay | Admitting: Cardiology

## 2023-03-16 DIAGNOSIS — I480 Paroxysmal atrial fibrillation: Secondary | ICD-10-CM

## 2023-03-16 DIAGNOSIS — Z952 Presence of prosthetic heart valve: Secondary | ICD-10-CM

## 2023-03-16 NOTE — Telephone Encounter (Signed)
Refill request for warfarin:  Last INR was 2.0 on 02/09/23 Next INR due 03/23/23 LOV was 05/28/22  Refill approved.

## 2023-03-23 ENCOUNTER — Ambulatory Visit: Payer: Medicare Other | Attending: Cardiology

## 2023-03-23 DIAGNOSIS — I480 Paroxysmal atrial fibrillation: Secondary | ICD-10-CM | POA: Diagnosis not present

## 2023-03-23 DIAGNOSIS — Z5181 Encounter for therapeutic drug level monitoring: Secondary | ICD-10-CM | POA: Diagnosis not present

## 2023-03-23 LAB — POCT INR: INR: 2.4 (ref 2.0–3.0)

## 2023-03-23 NOTE — Patient Instructions (Signed)
Description   Continue taking 5mg  daily except 2.5mg  on Sundays, Mondays, Wednesday and Fridays.  Recheck INR in 7 weeks.  Please call coumadin clinic with any questions or medication changes  Coumadin Clinic: [phone].

## 2023-03-31 ENCOUNTER — Other Ambulatory Visit: Payer: Self-pay | Admitting: Cardiology

## 2023-03-31 NOTE — Telephone Encounter (Signed)
 Rx was filled in December according to pharmacy note for 1 year. Refused request for refill due to refill inappropriate.

## 2023-05-11 ENCOUNTER — Ambulatory Visit: Payer: Medicare Other | Attending: Cardiology

## 2023-05-11 DIAGNOSIS — Z5181 Encounter for therapeutic drug level monitoring: Secondary | ICD-10-CM

## 2023-05-11 DIAGNOSIS — I480 Paroxysmal atrial fibrillation: Secondary | ICD-10-CM

## 2023-05-11 LAB — POCT INR: INR: 2.7 (ref 2.0–3.0)

## 2023-05-11 NOTE — Patient Instructions (Signed)
 Description   Continue taking 5mg  daily except 2.5mg  on Sundays, Mondays, Wednesday and Fridays.  Recheck INR in 7 weeks.  Please call coumadin clinic with any questions or medication changes  Coumadin Clinic: [phone].

## 2023-05-30 ENCOUNTER — Other Ambulatory Visit: Payer: Self-pay | Admitting: Cardiology

## 2023-05-30 DIAGNOSIS — I2510 Atherosclerotic heart disease of native coronary artery without angina pectoris: Secondary | ICD-10-CM

## 2023-05-31 NOTE — Telephone Encounter (Signed)
 Rx refill sent to pharmacy.

## 2023-06-14 ENCOUNTER — Other Ambulatory Visit: Payer: Self-pay | Admitting: Cardiology

## 2023-06-29 ENCOUNTER — Other Ambulatory Visit: Payer: Self-pay

## 2023-06-29 ENCOUNTER — Ambulatory Visit: Attending: Cardiology

## 2023-06-29 DIAGNOSIS — Z5181 Encounter for therapeutic drug level monitoring: Secondary | ICD-10-CM | POA: Diagnosis not present

## 2023-06-29 DIAGNOSIS — I480 Paroxysmal atrial fibrillation: Secondary | ICD-10-CM | POA: Diagnosis not present

## 2023-06-29 DIAGNOSIS — I10 Essential (primary) hypertension: Secondary | ICD-10-CM

## 2023-06-29 LAB — POCT INR: INR: 2.2 (ref 2.0–3.0)

## 2023-06-29 MED ORDER — AMLODIPINE BESYLATE 5 MG PO TABS: 5.0000 mg | ORAL_TABLET | Freq: Every day | ORAL | 0 refills | Status: DC

## 2023-06-29 NOTE — Telephone Encounter (Signed)
 Pt out of medication. Needs 90 day supply sent to [facility] as soon as possible.

## 2023-06-29 NOTE — Patient Instructions (Signed)
 Description   Continue taking 5mg  daily except 2.5mg  on Sundays, Mondays, Wednesday and Fridays.  Recheck INR in 7 weeks.  Please call coumadin clinic with any questions or medication changes  Coumadin Clinic: [phone].

## 2023-07-14 ENCOUNTER — Other Ambulatory Visit: Payer: Self-pay | Admitting: Cardiology

## 2023-07-14 DIAGNOSIS — I10 Essential (primary) hypertension: Secondary | ICD-10-CM

## 2023-07-19 ENCOUNTER — Other Ambulatory Visit: Payer: Self-pay | Admitting: Cardiology

## 2023-07-20 MED ORDER — TELMISARTAN 80 MG PO TABS: 80.0000 mg | ORAL_TABLET | Freq: Every day | ORAL | 0 refills | Status: DC

## 2023-07-20 NOTE — Addendum Note (Signed)
 Addended by: Ary Rudnick, Jyl Or L on: 07/20/2023 03:06 PM   Modules accepted: Orders

## 2023-08-04 ENCOUNTER — Ambulatory Visit: Admitting: Cardiology

## 2023-08-17 ENCOUNTER — Ambulatory Visit: Attending: Cardiology

## 2023-08-17 DIAGNOSIS — Z5181 Encounter for therapeutic drug level monitoring: Secondary | ICD-10-CM

## 2023-08-17 DIAGNOSIS — I480 Paroxysmal atrial fibrillation: Secondary | ICD-10-CM | POA: Diagnosis not present

## 2023-08-17 LAB — POCT INR: INR: 2.1 (ref 2.0–3.0)

## 2023-08-17 NOTE — Patient Instructions (Signed)
 Description   Continue taking 5mg  daily except 2.5mg  on Sundays, Mondays, Wednesday and Fridays.  Recheck INR in 8 weeks.  Please call coumadin clinic with any questions or medication changes  Coumadin Clinic: [phone].

## 2023-08-17 NOTE — Progress Notes (Signed)
Please see anticoagulation encounter.

## 2023-08-29 ENCOUNTER — Other Ambulatory Visit: Payer: Self-pay | Admitting: Cardiology

## 2023-08-29 DIAGNOSIS — I2510 Atherosclerotic heart disease of native coronary artery without angina pectoris: Secondary | ICD-10-CM

## 2023-09-13 NOTE — Progress Notes (Signed)
 Cardiology Office Note:    Date:  09/15/2023   ID:  Erik Montgomery, DOB 08-03-1940, MRN [id number]  PCP:  Silver Lamar LABOR, MD  Cardiologist:  Redell Leiter, MD    Referring MD: Silver Lamar LABOR, MD    ASSESSMENT:    1. S/P AVR   2. Long term (current) use of anticoagulants   3. Hx of CABG   4. H/O mitral valve repair   5. Essential hypertension   6. Stage 3a chronic kidney disease (HCC)   7. Mixed dyslipidemia   8. Anemia, unspecified type   9. Coronary artery disease involving native coronary artery of native heart without angina pectoris    PLAN:    In order of problems listed above:  Arland continues to do well long-term approaching 20 years with his AVR recheck echocardiogram continue warfarin INR goal 2-2.5 managed in her anticoagulant clinic Stable CAD having no anginal discomfort he is on good medical therapy including beta-blocker high intensity statin we will continue this regimen Well-controlled hypertension with hyperlipidemia previous anemia and CKD recheck his labs today   Next appointment: 1 year follow-up   Medication Adjustments/Labs and Tests Ordered: Current medicines are reviewed at length with the patient today.  Concerns regarding medicines are outlined above.  Orders Placed This Encounter  Procedures   EKG 12-Lead   Meds ordered this encounter  Medications   amLODipine  (NORVASC ) 5 MG tablet    Sig: Take 1 tablet (5 mg total) by mouth daily. Must keep appt on 08/2023 for additional refills    Dispense:  90 tablet    Refill:  0    Please keep upcoming yearly appt scheduled on 09/15/2023 for future refills. Thank you   metoprolol  succinate (TOPROL -XL) 25 MG 24 hr tablet    Sig: Take 0.5 tablets (12.5 mg total) by mouth daily.    Dispense:  45 tablet    Refill:  0    Please keep upcoming yearly appt scheduled on 09/15/2023 for future refills. Thank you   rosuvastatin  (CRESTOR ) 20 MG tablet    Sig: Take 1 tablet (20 mg total) by mouth daily.     Dispense:  90 tablet    Refill:  3   telmisartan  (MICARDIS ) 80 MG tablet    Sig: Take 1 tablet (80 mg total) by mouth daily. Patient must keep appointment on 09/15/23 for further refills. 3 rd/final attempt    Dispense:  90 tablet    Refill:  0    Patient must keep appointment on 09/15/23 for further refills. 3 rd/final attempt     History of Present Illness:    Erik Montgomery is a 83 y.o. male with a hx of valvular heart disease with mitral valve repair CABG and Saint Jude mechanical AVR in 2006 with long-term warfarin anticoagulation CAD paroxysmal atrial fibrillation hypertension and dyslipidemia last seen 05/28/2022.  His anticoagulation is managed in our warfarin clinic in my office.  Labs in November 2024 showed be anemic with a hemoglobin of 9.6 platelet count diminished 97,000 creatinine 1.54 GFR 45 cc/min sodium 139 potassium 4.4 cholesterol 119 LDL 64 non-HDL cholesterol 83  Compliance with diet, lifestyle and medications: Yes  He remains active golfs several days a week and is not having cardiovascular symptoms of edema shortness of breath chest pain palpitation or syncope he is approaching 20 years after his aortic valve replacement we will recheck an echocardiogram Last labs in November of last year with his PCP showed be anemic he looks  pale in my office I am going to go ahead and check labs today including CBC He has had no bleeding with his anticoagulant INR is remain in range generally Home blood pressure runs consistently in the low 130s over 70 Past Medical History:  Diagnosis Date   CAD (coronary artery disease), native coronary artery 04/25/2015   Overview:   Patent  SVG to RCA at cath 2009     Chronic right shoulder pain 02/28/2015   Coronary artery disease involving native coronary artery of native heart without angina pectoris 04/25/2015   Overview:  Patent  SVG to RCA at cath 2009   Elevated glucose 12/11/2019   Encounter for therapeutic drug monitoring 10/26/2016    Essential hypertension 09/02/2016   H/O mitral valve repair 04/25/2015   Heart valve replaced 10/12/2016   Hx of CABG 04/25/2015   Long term (current) use of anticoagulants 10/12/2016   Malaise and fatigue 12/11/2019   Mixed dyslipidemia 09/02/2016   Mood disorder (HCC) 09/02/2016   PAF (paroxysmal atrial fibrillation) (HCC) 10/26/2016   Paroxysmal atrial fibrillation (HCC) 10/26/2016   S/P arthroscopy of shoulder 05/20/2015   S/P AVR 10/12/2016   Stage 3a chronic kidney disease (HCC) 12/11/2019    Current Medications: Current Meds  Medication Sig   amoxicillin  (AMOXIL ) 500 MG tablet Take 4 tablets (2,000 mg total) by mouth as directed. Take 4 tablets 30 mins prior to dental procedure.   Glucos-Chondroit-Hyaluron-MSM (GLUCOSAMINE CHONDROITIN JOINT PO) Take 1 tablet by mouth daily.   JANTOVEN  5 MG tablet TAKE 1/2 TABLET TO 1 TABLET AS DIRECTED BY COUMADIN CLINIC   nitroGLYCERIN  (NITROSTAT ) 0.4 MG SL tablet Place 1 tablet (0.4 mg total) under the tongue every 5 (five) minutes as needed for chest pain.   Omega-3 Fatty Acids (FISH OIL) 1000 MG CPDR Take 1 capsule by mouth daily.   [DISCONTINUED] amLODipine  (NORVASC ) 5 MG tablet Take 1 tablet (5 mg total) by mouth daily. Must keep appt on 08/2023 for additional refills   [DISCONTINUED] metoprolol  succinate (TOPROL -XL) 25 MG 24 hr tablet Take 0.5 tablets (12.5 mg total) by mouth daily.   [DISCONTINUED] rosuvastatin  (CRESTOR ) 20 MG tablet TAKE ONE TABLET BY MOUTH DAILY   [DISCONTINUED] telmisartan  (MICARDIS ) 80 MG tablet Take 1 tablet (80 mg total) by mouth daily. Patient must keep appointment on 09/15/23 for further refills. 3 rd/final attempt      EKGs/Labs/Other Studies Reviewed:    The following studies were reviewed today:      EKG Interpretation Date/Time:  Wednesday September 15 2023 08:29:31 EDT Ventricular Rate:  58 PR Interval:  182 QRS Duration:  84 QT Interval:  416 QTC Calculation: 408 R Axis:   0  Text  Interpretation: Sinus bradycardia with sinus arrhythmia Possible Inferior infarct (cited on or before 12-Jun-2004) PoorR wave progression When compared with ECG of 12-Jun-2004 08:04, Nonspecific T wave abnormality no longer evident in Lateral leads Confirmed by Monetta Rogue ([id number]) on 09/15/2023 8:35:32 AM     Physical Exam:    VS:  BP (!) 102/46   Pulse (!) 58   Ht 5' 9 (1.753 m)   Wt 155 lb 6.4 oz (70.5 kg)   SpO2 96%   BMI 22.95 kg/m     Wt Readings from Last 3 Encounters:  09/15/23 155 lb 6.4 oz (70.5 kg)  05/28/22 155 lb 12.8 oz (70.7 kg)  04/15/21 155 lb (70.3 kg)     GEN:  Well nourished, well developed in no acute distress he has pallor of his  skin HEENT: Normal NECK: No JVD; No carotid bruits LYMPHATICS: No lymphadenopathy CARDIAC: Normal exam Saint Jude AVR sharp closing sound no murmur RRR, no murmurs, rubs, gallops RESPIRATORY:  Clear to auscultation without rales, wheezing or rhonchi  ABDOMEN: Soft, non-tender, non-distended MUSCULOSKELETAL:  No edema; No deformity  SKIN: Warm and dry NEUROLOGIC:  Alert and oriented x 3 PSYCHIATRIC:  Normal affect    Signed, Redell Leiter, MD  09/15/2023 8:50 AM    [facility]

## 2023-09-15 ENCOUNTER — Ambulatory Visit: Attending: Cardiology | Admitting: Cardiology

## 2023-09-15 ENCOUNTER — Encounter: Payer: Self-pay | Admitting: Cardiology

## 2023-09-15 VITALS — BP 102/46 | HR 58 | Ht 69.0 in | Wt 155.4 lb

## 2023-09-15 DIAGNOSIS — Z9889 Other specified postprocedural states: Secondary | ICD-10-CM

## 2023-09-15 DIAGNOSIS — I10 Essential (primary) hypertension: Secondary | ICD-10-CM

## 2023-09-15 DIAGNOSIS — D649 Anemia, unspecified: Secondary | ICD-10-CM

## 2023-09-15 DIAGNOSIS — I2510 Atherosclerotic heart disease of native coronary artery without angina pectoris: Secondary | ICD-10-CM | POA: Diagnosis not present

## 2023-09-15 DIAGNOSIS — E782 Mixed hyperlipidemia: Secondary | ICD-10-CM

## 2023-09-15 DIAGNOSIS — Z952 Presence of prosthetic heart valve: Secondary | ICD-10-CM

## 2023-09-15 DIAGNOSIS — Z951 Presence of aortocoronary bypass graft: Secondary | ICD-10-CM

## 2023-09-15 DIAGNOSIS — N1831 Chronic kidney disease, stage 3a: Secondary | ICD-10-CM

## 2023-09-15 DIAGNOSIS — Z7901 Long term (current) use of anticoagulants: Secondary | ICD-10-CM | POA: Diagnosis not present

## 2023-09-15 MED ORDER — METOPROLOL SUCCINATE ER 25 MG PO TB24: 12.5000 mg | ORAL_TABLET | Freq: Every day | ORAL | 0 refills | Status: DC

## 2023-09-15 MED ORDER — ROSUVASTATIN CALCIUM 20 MG PO TABS: 20.0000 mg | ORAL_TABLET | Freq: Every day | ORAL | 3 refills | Status: AC

## 2023-09-15 MED ORDER — TELMISARTAN 80 MG PO TABS: 80.0000 mg | ORAL_TABLET | Freq: Every day | ORAL | 0 refills | Status: DC

## 2023-09-15 MED ORDER — AMLODIPINE BESYLATE 5 MG PO TABS: 5.0000 mg | ORAL_TABLET | Freq: Every day | ORAL | 0 refills | Status: DC

## 2023-09-15 NOTE — Patient Instructions (Signed)
 Medication Instructions:  Your physician recommends that you continue on your current medications as directed. Please refer to the Current Medication list given to you today.  *If you need a refill on your cardiac medications before your next appointment, please call your pharmacy*  Lab Work: Your physician recommends that you return for lab work in:   Labs today: CMP, CBC, Lipids  If you have labs (blood work) drawn today and your tests are completely normal, you will receive your results only by: MyChart Message (if you have MyChart) OR A paper copy in the mail If you have any lab test that is abnormal or we need to change your treatment, we will call you to review the results.  Testing/Procedures: Your physician has requested that you have an echocardiogram. Echocardiography is a painless test that uses sound waves to create images of your heart. It provides your doctor with information about the size and shape of your heart and how well your heart's chambers and valves are working. This procedure takes approximately one hour. There are no restrictions for this procedure. Please do NOT wear cologne, perfume, aftershave, or lotions (deodorant is allowed). Please arrive 15 minutes prior to your appointment time.  Please note: We ask at that you not bring children with you during ultrasound (echo/ vascular) testing. Due to room size and safety concerns, children are not allowed in the ultrasound rooms during exams. Our front office staff cannot provide observation of children in our lobby area while testing is being conducted. An adult accompanying a patient to their appointment will only be allowed in the ultrasound room at the discretion of the ultrasound technician under special circumstances. We apologize for any inconvenience.   Follow-Up: At [facility], you and your health needs are our priority.  As part of our continuing mission to provide you with exceptional heart care,  our providers are all part of one team.  This team includes your primary Cardiologist (physician) and Advanced Practice Providers or APPs (Physician Assistants and Nurse Practitioners) who all work together to provide you with the care you need, when you need it.  Your next appointment:   1 year(s)  Provider:   Redell Leiter, MD    We recommend signing up for the patient portal called MyChart.  Sign up information is provided on this After Visit Summary.  MyChart is used to connect with patients for Virtual Visits (Telemedicine).  Patients are able to view lab/test results, encounter notes, upcoming appointments, etc.  Non-urgent messages can be sent to your provider as well.   To learn more about what you can do with MyChart, go to [email or web address].   Other Instructions None

## 2023-09-16 ENCOUNTER — Other Ambulatory Visit: Payer: Self-pay

## 2023-09-16 ENCOUNTER — Ambulatory Visit: Payer: Self-pay | Admitting: Cardiology

## 2023-09-16 DIAGNOSIS — D649 Anemia, unspecified: Secondary | ICD-10-CM

## 2023-09-16 LAB — CBC
Hematocrit: 26.6 % — ABNORMAL LOW (ref 37.5–51.0)
Hemoglobin: 8.7 g/dL — ABNORMAL LOW (ref 13.0–17.7)
MCH: 33 pg (ref 26.6–33.0)
MCHC: 32.7 g/dL (ref 31.5–35.7)
MCV: 101 fL — ABNORMAL HIGH (ref 79–97)
Platelets: 100 x10E3/uL — CL (ref 150–450)
RBC: 2.64 x10E6/uL — CL (ref 4.14–5.80)
RDW: 12.9 % (ref 11.6–15.4)
WBC: 3.5 x10E3/uL (ref 3.4–10.8)

## 2023-09-16 LAB — COMPREHENSIVE METABOLIC PANEL WITH GFR
ALT: 14 IU/L (ref 0–44)
AST: 18 IU/L (ref 0–40)
Albumin: 4.1 g/dL (ref 3.7–4.7)
Alkaline Phosphatase: 86 IU/L (ref 44–121)
BUN/Creatinine Ratio: 10 (ref 10–24)
BUN: 17 mg/dL (ref 8–27)
Bilirubin Total: 0.5 mg/dL (ref 0.0–1.2)
CO2: 22 mmol/L (ref 20–29)
Calcium: 9.1 mg/dL (ref 8.6–10.2)
Chloride: 108 mmol/L — ABNORMAL HIGH (ref 96–106)
Creatinine, Ser: 1.76 mg/dL — ABNORMAL HIGH (ref 0.76–1.27)
Globulin, Total: 2.6 g/dL (ref 1.5–4.5)
Glucose: 96 mg/dL (ref 70–99)
Potassium: 4.3 mmol/L (ref 3.5–5.2)
Sodium: 144 mmol/L (ref 134–144)
Total Protein: 6.7 g/dL (ref 6.0–8.5)
eGFR: 38 mL/min/1.73 — ABNORMAL LOW (ref >59)

## 2023-09-16 LAB — LIPID PANEL
Chol/HDL Ratio: 3.7 ratio (ref 0.0–5.0)
Cholesterol, Total: 128 mg/dL (ref 100–199)
HDL: 35 mg/dL — ABNORMAL LOW (ref >39)
LDL Chol Calc (NIH): 76 mg/dL (ref 0–99)
Triglycerides: 85 mg/dL (ref 0–149)
VLDL Cholesterol Cal: 17 mg/dL (ref 5–40)

## 2023-09-22 ENCOUNTER — Inpatient Hospital Stay: Attending: Hematology and Oncology | Admitting: Hematology and Oncology

## 2023-09-22 ENCOUNTER — Encounter: Payer: Self-pay | Admitting: Hematology and Oncology

## 2023-09-22 ENCOUNTER — Inpatient Hospital Stay

## 2023-09-22 ENCOUNTER — Other Ambulatory Visit: Payer: Self-pay

## 2023-09-22 ENCOUNTER — Other Ambulatory Visit: Payer: Self-pay | Admitting: Hematology and Oncology

## 2023-09-22 VITALS — BP 145/57 | HR 75 | Temp 98.0°F | Resp 14 | Ht 66.5 in | Wt 152.6 lb

## 2023-09-22 DIAGNOSIS — Z7901 Long term (current) use of anticoagulants: Secondary | ICD-10-CM | POA: Diagnosis not present

## 2023-09-22 DIAGNOSIS — D649 Anemia, unspecified: Secondary | ICD-10-CM | POA: Insufficient documentation

## 2023-09-22 DIAGNOSIS — N1831 Chronic kidney disease, stage 3a: Secondary | ICD-10-CM | POA: Insufficient documentation

## 2023-09-22 DIAGNOSIS — I129 Hypertensive chronic kidney disease with stage 1 through stage 4 chronic kidney disease, or unspecified chronic kidney disease: Secondary | ICD-10-CM | POA: Insufficient documentation

## 2023-09-22 DIAGNOSIS — Z79899 Other long term (current) drug therapy: Secondary | ICD-10-CM | POA: Insufficient documentation

## 2023-09-22 DIAGNOSIS — I480 Paroxysmal atrial fibrillation: Secondary | ICD-10-CM | POA: Diagnosis not present

## 2023-09-22 LAB — CBC WITH DIFFERENTIAL (CANCER CENTER ONLY)
Abs Immature Granulocytes: 0.01 K/uL (ref 0.00–0.07)
Basophils Absolute: 0 K/uL (ref 0.0–0.1)
Basophils Relative: 0 %
Eosinophils Absolute: 0.1 K/uL (ref 0.0–0.5)
Eosinophils Relative: 2 %
HCT: 26.2 % — ABNORMAL LOW (ref 39.0–52.0)
Hemoglobin: 8.9 g/dL — ABNORMAL LOW (ref 13.0–17.0)
Immature Granulocytes: 0 %
Lymphocytes Relative: 37 %
Lymphs Abs: 1.4 K/uL (ref 0.7–4.0)
MCH: 32.6 pg (ref 26.0–34.0)
MCHC: 34 g/dL (ref 30.0–36.0)
MCV: 96 fL (ref 80.0–100.0)
Monocytes Absolute: 0.4 K/uL (ref 0.1–1.0)
Monocytes Relative: 9 %
Neutro Abs: 2 K/uL (ref 1.7–7.7)
Neutrophils Relative %: 52 %
Platelet Count: 126 K/uL — ABNORMAL LOW (ref 150–400)
RBC: 2.73 MIL/uL — ABNORMAL LOW (ref 4.22–5.81)
RDW: 13.1 % (ref 11.5–15.5)
Smear Review: NORMAL
WBC Count: 3.9 K/uL — ABNORMAL LOW (ref 4.0–10.5)
nRBC: 0 % (ref 0.0–0.2)

## 2023-09-22 LAB — CMP (CANCER CENTER ONLY)
ALT: 17 U/L (ref 0–44)
AST: 27 U/L (ref 15–41)
Albumin: 4.3 g/dL (ref 3.5–5.0)
Alkaline Phosphatase: 81 U/L (ref 38–126)
Anion gap: 13 (ref 5–15)
BUN: 21 mg/dL (ref 8–23)
CO2: 23 mmol/L (ref 22–32)
Calcium: 9.3 mg/dL (ref 8.9–10.3)
Chloride: 107 mmol/L (ref 98–111)
Creatinine: 1.91 mg/dL — ABNORMAL HIGH (ref 0.61–1.24)
GFR, Estimated: 35 mL/min — ABNORMAL LOW (ref >60)
Glucose, Bld: 102 mg/dL — ABNORMAL HIGH (ref 70–99)
Potassium: 4.4 mmol/L (ref 3.5–5.1)
Sodium: 142 mmol/L (ref 135–145)
Total Bilirubin: 0.6 mg/dL (ref 0.0–1.2)
Total Protein: 7.4 g/dL (ref 6.5–8.1)

## 2023-09-22 LAB — FOLATE: Folate: 18.2 ng/mL (ref >5.9)

## 2023-09-22 LAB — TSH: TSH: 1.532 u[IU]/mL (ref 0.350–4.500)

## 2023-09-22 LAB — IRON AND TIBC
Iron: 64 ug/dL (ref 45–182)
Saturation Ratios: 22 % (ref 17.9–39.5)
TIBC: 294 ug/dL (ref 250–450)
UIBC: 230 ug/dL

## 2023-09-22 LAB — FERRITIN: Ferritin: 35 ng/mL (ref 24–336)

## 2023-09-22 LAB — VITAMIN B12: Vitamin B-12: 325 pg/mL (ref 180–914)

## 2023-09-22 NOTE — Progress Notes (Signed)
 [facility] CONSULT NOTE  Patient Care Team: Silver Lamar LABOR, MD as PCP - General (Family Medicine) Monetta Redell PARAS, MD as PCP - Cardiology (Cardiology) Monetta Redell PARAS, MD as Consulting Physician (Cardiology)  ASSESSMENT & PLAN:  Anemia: History of anemia noted with labs drawn 09/15/2023 with hemoglobin 8.7. He presents for workup. He is asymptomatic, stating he continues to play golf 7 days a week without shortness of breath or fatigue. He has never taken iron supplements in the past. Today, his hemoglobin is slightly improved at 8.9. Iron studies are pending. We discussed potential treatment plans including iron (oral or IV), Vitamin B12 and/or Folic Acid .   All questions were answered. The patient knows to call the clinic with any problems, questions or concerns.  The total time spent in the appointment was 45 minutes encounter with patients including review of chart and various tests results, discussions about plan of care and coordination of care plan  Eleanor LABOR Bach, NP 8/20/20251:36 PM   CHIEF COMPLAINTS/PURPOSE OF CONSULTATION:  Anemia  HISTORY OF PRESENTING ILLNESS:  Erik Montgomery 83 y.o. male is here because of anemia  He was found to have abnormal CBC from 09/15/2023 He denies recent chest pain on exertion, shortness of breath on minimal exertion, pre-syncopal episodes, or palpitations. He had not noticed any recent bleeding such as epistaxis, hematuria or hematochezia The patient denies over the counter NSAID ingestion. He is not  on antiplatelets agents. His last colonoscopy was 10-12 years ago. Results were normal with no need to repeat.  He had no prior history or diagnosis of cancer. His age appropriate screening programs are up-to-date. He denies any pica and eats a variety of diet. He never donated blood or received blood transfusion The patient was not prescribed oral iron supplements.  MEDICAL HISTORY:  Past Medical History:  Diagnosis  Date   CAD (coronary artery disease), native coronary artery 04/25/2015   Overview:   Patent  SVG to RCA at cath 2009     Chronic right shoulder pain 02/28/2015   Coronary artery disease involving native coronary artery of native heart without angina pectoris 04/25/2015   Overview:  Patent  SVG to RCA at cath 2009   Elevated glucose 12/11/2019   Encounter for therapeutic drug monitoring 10/26/2016   Essential hypertension 09/02/2016   H/O mitral valve repair 04/25/2015   Heart valve replaced 10/12/2016   Hx of CABG 04/25/2015   Long term (current) use of anticoagulants 10/12/2016   Malaise and fatigue 12/11/2019   Mixed dyslipidemia 09/02/2016   Mood disorder (HCC) 09/02/2016   PAF (paroxysmal atrial fibrillation) (HCC) 10/26/2016   Paroxysmal atrial fibrillation (HCC) 10/26/2016   S/P arthroscopy of shoulder 05/20/2015   S/P AVR 10/12/2016   Stage 3a chronic kidney disease (HCC) 12/11/2019    SURGICAL HISTORY: Past Surgical History:  Procedure Laterality Date   CARDIAC VALVE REPLACEMENT     CATARACT EXTRACTION, BILATERAL      SOCIAL HISTORY: Social History   Socioeconomic History   Marital status: Single    Spouse name: Not on file   Number of children: Not on file   Years of education: Not on file   Highest education level: Not on file  Occupational History   Not on file  Tobacco Use   Smoking status: Never    Passive exposure: Never   Smokeless tobacco: Never  Vaping Use   Vaping status: Never Used  Substance and Sexual Activity   Alcohol use: No  Drug use: No   Sexual activity: Not on file  Other Topics Concern   Not on file  Social History Narrative   Not on file   Social Drivers of Health   Financial Resource Strain: Not on file  Food Insecurity: No Food Insecurity (09/22/2023)   Hunger Vital Sign    Worried About Running Out of Food in the Last Year: Never true    Ran Out of Food in the Last Year: Never true  Transportation Needs: No  Transportation Needs (09/22/2023)   PRAPARE - Administrator, Civil Service (Medical): No    Lack of Transportation (Non-Medical): No  Physical Activity: Not on file  Stress: Not on file  Social Connections: Not on file  Intimate Partner Violence: Not At Risk (09/22/2023)   Humiliation, Afraid, Rape, and Kick questionnaire    Fear of Current or Ex-Partner: No    Emotionally Abused: No    Physically Abused: No    Sexually Abused: No    FAMILY HISTORY: Family History  Problem Relation Age of Onset   Ovarian cancer Mother    Cancer Father    Heart disease Brother     ALLERGIES:  has no known allergies.  MEDICATIONS:  Current Outpatient Medications  Medication Sig Dispense Refill   amLODipine  (NORVASC ) 5 MG tablet Take 1 tablet (5 mg total) by mouth daily. Must keep appt on 08/2023 for additional refills 90 tablet 0   amoxicillin  (AMOXIL ) 500 MG tablet Take 4 tablets (2,000 mg total) by mouth as directed. Take 4 tablets 30 mins prior to dental procedure. 20 tablet 1   Glucos-Chondroit-Hyaluron-MSM (GLUCOSAMINE CHONDROITIN JOINT PO) Take 1 tablet by mouth daily.     JANTOVEN  5 MG tablet TAKE 1/2 TABLET TO 1 TABLET AS DIRECTED BY COUMADIN CLINIC 65 tablet 3   metoprolol  succinate (TOPROL -XL) 25 MG 24 hr tablet Take 0.5 tablets (12.5 mg total) by mouth daily. 45 tablet 0   nitroGLYCERIN  (NITROSTAT ) 0.4 MG SL tablet Place 1 tablet (0.4 mg total) under the tongue every 5 (five) minutes as needed for chest pain. 25 tablet 1   Omega-3 Fatty Acids (FISH OIL) 1000 MG CPDR Take 1 capsule by mouth daily.     rosuvastatin  (CRESTOR ) 20 MG tablet Take 1 tablet (20 mg total) by mouth daily. 90 tablet 3   telmisartan  (MICARDIS ) 80 MG tablet Take 1 tablet (80 mg total) by mouth daily. Patient must keep appointment on 09/15/23 for further refills. 3 rd/final attempt 90 tablet 0   No current facility-administered medications for this visit.    REVIEW OF SYSTEMS:   Constitutional: Denies  fevers, chills or abnormal night sweats Eyes: Denies blurriness of vision, double vision or watery eyes Ears, nose, mouth, throat, and face: Denies mucositis or sore throat Respiratory: Denies cough, dyspnea or wheezes Cardiovascular: Denies palpitation, chest discomfort or lower extremity swelling Gastrointestinal:  Denies nausea, heartburn or change in bowel habits Skin: Denies abnormal skin rashes Lymphatics: Denies new lymphadenopathy or easy bruising Neurological:Denies numbness, tingling or new weaknesses Behavioral/Psych: Mood is stable, no new changes  All other systems were reviewed with the patient and are negative.  PHYSICAL EXAMINATION: ECOG PERFORMANCE STATUS: 1 - Symptomatic but completely ambulatory  Vitals:   09/22/23 1111  BP: (!) 145/57  Pulse: 75  Resp: 14  Temp: 98 F (36.7 C)  SpO2: 100%   Filed Weights   09/22/23 1111  Weight: 152 lb 9.6 oz (69.2 kg)    GENERAL:alert, no distress and  comfortable SKIN: skin color, texture, turgor are normal, no rashes or significant lesions EYES: normal, conjunctiva are pink and non-injected, sclera clear OROPHARYNX:no exudate, no erythema and lips, buccal mucosa, and tongue normal  NECK: supple, thyroid  normal size, non-tender, without nodularity LYMPH:  no palpable lymphadenopathy in the cervical, axillary or inguinal LUNGS: clear to auscultation and percussion with normal breathing effort HEART: regular rate & rhythm and no murmurs and no lower extremity edema ABDOMEN:abdomen soft, non-tender and normal bowel sounds Musculoskeletal:no cyanosis of digits and no clubbing  PSYCH: alert & oriented x 3 with fluent speech NEURO: no focal motor/sensory deficits  RADIOGRAPHIC STUDIES: I have personally reviewed the radiological images as listed and agreed with the findings in the report. No results found.

## 2023-09-27 LAB — MULTIPLE MYELOMA PANEL, SERUM
Albumin SerPl Elph-Mcnc: 3.8 g/dL (ref 2.9–4.4)
Albumin/Glob SerPl: 1.3 (ref 0.7–1.7)
Alpha 1: 0.2 g/dL (ref 0.0–0.4)
Alpha2 Glob SerPl Elph-Mcnc: 0.5 g/dL (ref 0.4–1.0)
B-Globulin SerPl Elph-Mcnc: 1.1 g/dL (ref 0.7–1.3)
Gamma Glob SerPl Elph-Mcnc: 1.1 g/dL (ref 0.4–1.8)
Globulin, Total: 3 g/dL (ref 2.2–3.9)
IgA: 472 mg/dL — ABNORMAL HIGH (ref 61–437)
IgG (Immunoglobin G), Serum: 1224 mg/dL (ref 603–1613)
IgM (Immunoglobulin M), Srm: 58 mg/dL (ref 15–143)
Total Protein ELP: 6.8 g/dL (ref 6.0–8.5)

## 2023-10-12 ENCOUNTER — Ambulatory Visit: Attending: Cardiology

## 2023-10-12 DIAGNOSIS — I480 Paroxysmal atrial fibrillation: Secondary | ICD-10-CM | POA: Diagnosis not present

## 2023-10-12 DIAGNOSIS — Z7901 Long term (current) use of anticoagulants: Secondary | ICD-10-CM | POA: Diagnosis not present

## 2023-10-12 DIAGNOSIS — Z5181 Encounter for therapeutic drug level monitoring: Secondary | ICD-10-CM

## 2023-10-12 LAB — POCT INR: INR: 3.2 — AB (ref 2.0–3.0)

## 2023-10-12 NOTE — Patient Instructions (Signed)
 Take 0.5 tablet tonight only then Continue taking 5mg  daily except 2.5mg  on Sundays, Mondays, Wednesday and Fridays.  Recheck INR in 8 weeks.  Please call coumadin clinic with any questions or medication changes  Coumadin Clinic: [phone].

## 2023-10-27 ENCOUNTER — Other Ambulatory Visit: Payer: Self-pay | Admitting: Cardiology

## 2023-10-27 DIAGNOSIS — Z952 Presence of prosthetic heart valve: Secondary | ICD-10-CM

## 2023-10-27 DIAGNOSIS — I480 Paroxysmal atrial fibrillation: Secondary | ICD-10-CM

## 2023-10-27 NOTE — Telephone Encounter (Signed)
 Warfarin 5mg  refill Afib Last INR 10/12/23 Last OV 09/15/23

## 2023-11-11 ENCOUNTER — Encounter: Payer: Self-pay | Admitting: Cardiology

## 2023-11-11 ENCOUNTER — Ambulatory Visit: Attending: Cardiology

## 2023-11-11 DIAGNOSIS — Z9889 Other specified postprocedural states: Secondary | ICD-10-CM

## 2023-11-11 DIAGNOSIS — Z952 Presence of prosthetic heart valve: Secondary | ICD-10-CM

## 2023-11-11 DIAGNOSIS — D649 Anemia, unspecified: Secondary | ICD-10-CM

## 2023-11-11 DIAGNOSIS — Z7901 Long term (current) use of anticoagulants: Secondary | ICD-10-CM

## 2023-11-11 DIAGNOSIS — E782 Mixed hyperlipidemia: Secondary | ICD-10-CM

## 2023-11-11 DIAGNOSIS — N1831 Chronic kidney disease, stage 3a: Secondary | ICD-10-CM

## 2023-11-11 DIAGNOSIS — I2510 Atherosclerotic heart disease of native coronary artery without angina pectoris: Secondary | ICD-10-CM

## 2023-11-11 DIAGNOSIS — Z951 Presence of aortocoronary bypass graft: Secondary | ICD-10-CM

## 2023-11-11 DIAGNOSIS — I10 Essential (primary) hypertension: Secondary | ICD-10-CM

## 2023-11-11 LAB — ECHOCARDIOGRAM COMPLETE
AV Mean grad: 11.5 mmHg
AV Peak grad: 24.5 mmHg
Ao pk vel: 2.47 m/s
Area-P 1/2: 2.87 cm2
P 1/2 time: 422 ms
S' Lateral: 3.1 cm

## 2023-12-07 ENCOUNTER — Ambulatory Visit: Attending: Cardiology

## 2023-12-07 DIAGNOSIS — I480 Paroxysmal atrial fibrillation: Secondary | ICD-10-CM

## 2023-12-07 DIAGNOSIS — Z5181 Encounter for therapeutic drug level monitoring: Secondary | ICD-10-CM | POA: Diagnosis not present

## 2023-12-07 DIAGNOSIS — Z7901 Long term (current) use of anticoagulants: Secondary | ICD-10-CM | POA: Diagnosis not present

## 2023-12-07 LAB — POCT INR: INR: 2.3 (ref 2.0–3.0)

## 2023-12-07 NOTE — Patient Instructions (Signed)
 Continue taking 5mg  daily except 2.5mg  on Sundays, Mondays, Wednesday and Fridays.  Recheck INR in 8 weeks.  Please call coumadin clinic with any questions or medication changes  Coumadin Clinic: [phone].

## 2023-12-10 ENCOUNTER — Other Ambulatory Visit: Payer: Self-pay | Admitting: Cardiology

## 2023-12-26 ENCOUNTER — Other Ambulatory Visit: Payer: Self-pay | Admitting: Cardiology

## 2024-01-04 ENCOUNTER — Other Ambulatory Visit: Payer: Self-pay

## 2024-01-04 DIAGNOSIS — I10 Essential (primary) hypertension: Secondary | ICD-10-CM

## 2024-01-04 MED ORDER — AMLODIPINE BESYLATE 5 MG PO TABS: 5.0000 mg | ORAL_TABLET | Freq: Every day | ORAL | 2 refills | Status: AC

## 2024-02-01 ENCOUNTER — Ambulatory Visit: Attending: Cardiology

## 2024-02-01 DIAGNOSIS — Z5181 Encounter for therapeutic drug level monitoring: Secondary | ICD-10-CM

## 2024-02-01 DIAGNOSIS — Z7901 Long term (current) use of anticoagulants: Secondary | ICD-10-CM | POA: Diagnosis not present

## 2024-02-01 DIAGNOSIS — I480 Paroxysmal atrial fibrillation: Secondary | ICD-10-CM | POA: Diagnosis not present

## 2024-02-01 LAB — POCT INR: INR: 2.2 (ref 2.0–3.0)

## 2024-02-01 NOTE — Patient Instructions (Signed)
 Continue taking 5mg  daily except 2.5mg  on Sundays, Mondays, Wednesday and Fridays.  Recheck INR in 8 weeks.  Please call coumadin clinic with any questions or medication changes  Coumadin Clinic: [phone].

## 2024-02-24 ENCOUNTER — Other Ambulatory Visit: Payer: Self-pay | Admitting: Cardiology

## 2024-02-24 DIAGNOSIS — Z952 Presence of prosthetic heart valve: Secondary | ICD-10-CM

## 2024-02-24 DIAGNOSIS — I480 Paroxysmal atrial fibrillation: Secondary | ICD-10-CM

## 2024-02-29 NOTE — Telephone Encounter (Signed)
 Warfarin 5mg  Dx-Afib Last INR Check-02/01/24 due 03/28/24 Last OV- 09/15/23

## 2024-03-28 ENCOUNTER — Ambulatory Visit
